# Patient Record
Sex: Male | Born: 1945 | Race: Black or African American | Hispanic: No | Smoking: Current every day smoker
Health system: Southern US, Community
[De-identification: ages and names within clinical notes are randomized; demographics above are authoritative.]

## PROBLEM LIST (undated history)

## (undated) DIAGNOSIS — L8990 Pressure ulcer of unspecified site, unspecified stage: Secondary | ICD-10-CM

## (undated) DIAGNOSIS — G893 Neoplasm related pain (acute) (chronic): Secondary | ICD-10-CM

## (undated) DIAGNOSIS — C61 Malignant neoplasm of prostate: Principal | ICD-10-CM

## (undated) DIAGNOSIS — E1143 Type 2 diabetes mellitus with diabetic autonomic (poly)neuropathy: Secondary | ICD-10-CM

## (undated) DIAGNOSIS — J449 Chronic obstructive pulmonary disease, unspecified: Secondary | ICD-10-CM

## (undated) DIAGNOSIS — Z96641 Presence of right artificial hip joint: Secondary | ICD-10-CM

## (undated) DIAGNOSIS — I10 Essential (primary) hypertension: Principal | ICD-10-CM

## (undated) DIAGNOSIS — E119 Type 2 diabetes mellitus without complications: Secondary | ICD-10-CM

## (undated) DIAGNOSIS — F329 Major depressive disorder, single episode, unspecified: Secondary | ICD-10-CM

## (undated) DIAGNOSIS — K257 Chronic gastric ulcer without hemorrhage or perforation: Secondary | ICD-10-CM

## (undated) DIAGNOSIS — Z794 Long term (current) use of insulin: Secondary | ICD-10-CM

## (undated) HISTORY — DX: Essential (primary) hypertension: I10

## (undated) HISTORY — DX: Type 2 diabetes mellitus with diabetic autonomic (poly)neuropathy: E11.43

## (undated) HISTORY — DX: Presence of right artificial hip joint: Z96.641

## (undated) HISTORY — PX: OTHER SURGICAL HISTORY: SHX169

## (undated) HISTORY — DX: Chronic obstructive pulmonary disease, unspecified: J44.9

## (undated) HISTORY — PX: TOTAL HIP ARTHROPLASTY: SHX124

## (undated) HISTORY — DX: Type 2 diabetes mellitus without complications: E11.9

## (undated) HISTORY — DX: Long term (current) use of insulin: Z79.4

## (undated) HISTORY — PX: PROSTATE SURGERY: SHX751

## (undated) HISTORY — DX: Chronic gastric ulcer without hemorrhage or perforation: K25.7

## (undated) HISTORY — DX: Malignant neoplasm of prostate: C61

## (undated) HISTORY — DX: Neoplasm related pain (acute) (chronic): G89.3

## (undated) HISTORY — DX: Major depressive disorder, single episode, unspecified: F32.9

---

## 2001-04-10 ENCOUNTER — Emergency Department (HOSPITAL_COMMUNITY): Admission: EM | Admit: 2001-04-10 | Discharge: 2001-04-10 | Payer: Self-pay | Admitting: Emergency Medicine

## 2012-12-15 ENCOUNTER — Telehealth: Payer: Self-pay | Admitting: Oncology

## 2012-12-15 NOTE — Telephone Encounter (Signed)
PT SCHEDULED TO SEE DR. GRANFORTUNA 08/06 @ 11; LABS 07/28 @ 12:15 REFERRING DR. THAKURI/[facility] DX- METS PROSTATE CA WELCOME PACKET MAILED.

## 2012-12-15 NOTE — Telephone Encounter (Signed)
C/D 12/15/12 for appt. 01/14/13

## 2012-12-22 ENCOUNTER — Telehealth: Payer: Self-pay | Admitting: *Deleted

## 2012-12-22 NOTE — Telephone Encounter (Signed)
Jack Barrett daughter called reporting he had "[facility]" in [place] and needs a referral for home care now that he is here with her.  Reported has open wounds to his tailbone and needs someone to perform baths. First appointment with provider at [facility] is January 14, 2013 and advised her to call his insurance company to determine what agency to use and Dr. Ruffin Frederick for a referral.   Tried to call back to instruct her to call [facility] for a referral but she does not have voicemail set up.

## 2013-01-05 ENCOUNTER — Other Ambulatory Visit: Payer: Self-pay | Admitting: Oncology

## 2013-01-05 ENCOUNTER — Encounter: Payer: Self-pay | Admitting: Oncology

## 2013-01-05 ENCOUNTER — Telehealth: Payer: Self-pay | Admitting: *Deleted

## 2013-01-05 ENCOUNTER — Telehealth: Payer: Self-pay | Admitting: Oncology

## 2013-01-05 ENCOUNTER — Other Ambulatory Visit (HOSPITAL_BASED_OUTPATIENT_CLINIC_OR_DEPARTMENT_OTHER): Payer: Medicare Other | Admitting: Lab

## 2013-01-05 DIAGNOSIS — C801 Malignant (primary) neoplasm, unspecified: Secondary | ICD-10-CM

## 2013-01-05 DIAGNOSIS — C61 Malignant neoplasm of prostate: Secondary | ICD-10-CM

## 2013-01-05 HISTORY — DX: Malignant neoplasm of prostate: C61

## 2013-01-05 LAB — COMPREHENSIVE METABOLIC PANEL (CC13)
ALT: 6 U/L (ref 0–55)
AST: 10 U/L (ref 5–34)
Albumin: 2.9 g/dL — ABNORMAL LOW (ref 3.5–5.0)
Alkaline Phosphatase: 100 U/L (ref 40–150)
BUN: 18.5 mg/dL (ref 7.0–26.0)
Calcium: 8.1 mg/dL — ABNORMAL LOW (ref 8.4–10.4)
Chloride: 101 mEq/L (ref 98–109)
Potassium: 4.1 mEq/L (ref 3.5–5.1)
Sodium: 135 mEq/L — ABNORMAL LOW (ref 136–145)
Total Protein: 7.2 g/dL (ref 6.4–8.3)

## 2013-01-05 LAB — CBC WITH DIFFERENTIAL/PLATELET
Basophils Absolute: 0.1 10*3/uL (ref 0.0–0.1)
EOS%: 0.8 % (ref 0.0–7.0)
Eosinophils Absolute: 0.1 10*3/uL (ref 0.0–0.5)
HGB: 11.3 g/dL — ABNORMAL LOW (ref 13.0–17.1)
MCH: 28.4 pg (ref 27.2–33.4)
MONO%: 5 % (ref 0.0–14.0)
NEUT#: 7.5 10*3/uL — ABNORMAL HIGH (ref 1.5–6.5)
RBC: 3.96 10*6/uL — ABNORMAL LOW (ref 4.20–5.82)
RDW: 17.6 % — ABNORMAL HIGH (ref 11.0–14.6)
lymph#: 1 10*3/uL (ref 0.9–3.3)

## 2013-01-05 NOTE — Telephone Encounter (Signed)
pt's daughter came by today and wants to know if Dr. Reece Agar will refill narcotics, talked to Amy H RN, she suggested that pt contact previous MD to refill meds

## 2013-01-05 NOTE — Telephone Encounter (Signed)
Patient here with his daughter.  She says he is running out of meds and asked can I do something for his wounds.  Discussed doctor patient relationship establishment.  Recommended she take him to ER for evaluation.  She left, expressed red tape.

## 2013-01-14 ENCOUNTER — Ambulatory Visit (HOSPITAL_BASED_OUTPATIENT_CLINIC_OR_DEPARTMENT_OTHER): Payer: Medicare Other | Admitting: Oncology

## 2013-01-14 ENCOUNTER — Ambulatory Visit: Payer: Medicare Other

## 2013-01-14 VITALS — BP 121/76 | HR 80 | Temp 97.7°F | Resp 18 | Ht 77.0 in | Wt 175.1 lb

## 2013-01-14 DIAGNOSIS — E1143 Type 2 diabetes mellitus with diabetic autonomic (poly)neuropathy: Secondary | ICD-10-CM

## 2013-01-14 DIAGNOSIS — C61 Malignant neoplasm of prostate: Secondary | ICD-10-CM

## 2013-01-14 DIAGNOSIS — Z794 Long term (current) use of insulin: Secondary | ICD-10-CM

## 2013-01-14 DIAGNOSIS — F329 Major depressive disorder, single episode, unspecified: Secondary | ICD-10-CM

## 2013-01-14 DIAGNOSIS — E1149 Type 2 diabetes mellitus with other diabetic neurological complication: Secondary | ICD-10-CM

## 2013-01-14 DIAGNOSIS — R627 Adult failure to thrive: Secondary | ICD-10-CM

## 2013-01-14 DIAGNOSIS — G893 Neoplasm related pain (acute) (chronic): Secondary | ICD-10-CM

## 2013-01-14 DIAGNOSIS — Z96641 Presence of right artificial hip joint: Secondary | ICD-10-CM

## 2013-01-14 DIAGNOSIS — Z96649 Presence of unspecified artificial hip joint: Secondary | ICD-10-CM

## 2013-01-14 DIAGNOSIS — I10 Essential (primary) hypertension: Secondary | ICD-10-CM

## 2013-01-14 DIAGNOSIS — F341 Dysthymic disorder: Secondary | ICD-10-CM

## 2013-01-14 DIAGNOSIS — K257 Chronic gastric ulcer without hemorrhage or perforation: Secondary | ICD-10-CM

## 2013-01-14 DIAGNOSIS — N189 Chronic kidney disease, unspecified: Secondary | ICD-10-CM

## 2013-01-14 DIAGNOSIS — J449 Chronic obstructive pulmonary disease, unspecified: Secondary | ICD-10-CM

## 2013-01-14 DIAGNOSIS — G909 Disorder of the autonomic nervous system, unspecified: Secondary | ICD-10-CM

## 2013-01-14 DIAGNOSIS — R32 Unspecified urinary incontinence: Secondary | ICD-10-CM

## 2013-01-14 DIAGNOSIS — C7951 Secondary malignant neoplasm of bone: Secondary | ICD-10-CM

## 2013-01-14 MED ORDER — ALPRAZOLAM 0.5 MG PO TABS: 1.0000 mg | ORAL_TABLET | Freq: Three times a day (TID) | ORAL | Status: DC | PRN

## 2013-01-14 MED ORDER — ONDANSETRON 8 MG PO TBDP: 8.0000 mg | ORAL_TABLET | Freq: Three times a day (TID) | ORAL | Status: DC | PRN

## 2013-01-14 MED ORDER — AMLODIPINE BESYLATE 5 MG PO TABS: 5.0000 mg | ORAL_TABLET | Freq: Every day | ORAL | Status: DC

## 2013-01-14 MED ORDER — HYDROMORPHONE HCL 4 MG PO TABS: 8.0000 mg | ORAL_TABLET | ORAL | Status: DC | PRN

## 2013-01-14 MED ORDER — ALPRAZOLAM 1 MG PO TABS: 1.0000 mg | ORAL_TABLET | Freq: Three times a day (TID) | ORAL | Status: DC | PRN

## 2013-01-14 MED ORDER — MORPHINE SULFATE ER 15 MG PO TBCR: 60.0000 mg | EXTENDED_RELEASE_TABLET | Freq: Three times a day (TID) | ORAL | Status: DC

## 2013-01-14 MED ORDER — ABIRATERONE ACETATE 250 MG PO TABS: 1000.0000 mg | ORAL_TABLET | Freq: Every day | ORAL | Status: DC

## 2013-01-14 MED ORDER — MORPHINE SULFATE ER 60 MG PO TBCR: 60.0000 mg | EXTENDED_RELEASE_TABLET | Freq: Three times a day (TID) | ORAL | Status: DC

## 2013-01-14 MED ORDER — HYDROMORPHONE HCL 8 MG PO TABS: 8.0000 mg | ORAL_TABLET | ORAL | Status: DC | PRN

## 2013-01-14 MED ORDER — PREDNISONE 5 MG PO TABS: 5.0000 mg | ORAL_TABLET | Freq: Two times a day (BID) | ORAL | Status: DC

## 2013-01-14 NOTE — Progress Notes (Signed)
New Patient Hematology-Oncology Evaluation   Jack Barrett [id number] 10/06/1945 67 y.o. 01/14/2013  CC: Dr Earvin Hansen, [facility] Dr, Sherrie Sport, [place]   Reason for referral:  Assume care of this man with metastatic prostate cancer who is moving to [place]   HPI:  67 year old man, retired [place],  and classical studies, high school teacher initially diagnosed with Gleason 4+5  prostate cancer in August 2007. He underwent radical prostatectomy with lymph node dissection 09/04/2006. One of 3 pelvic nodes positive with extranodal extension. Positive perineural invasion. Positive surgical margin. Negative seminal vesicle invasion. PSA was elevated postop He received postoperative radiation therapy. Marland Kitchen He was put on leuprolide through 2012. Initial treatment at the [facility] in [place] with subsequent care transferred to [facility] [place]. PSA normalized on the leuprolide. He elected to stop the medication in 2012. PSA began to rise up to 6.54 by March 2013, 16.6 by May, 62.3 by July. He was started on bicalutamide. PSA initially fell but then rose again up to 54.6 by 06/20/2012. He was started on a trial of Enzalutamide. He had poor tolerance to this drug with nausea, vomiting, and fatigue.  A bone scan from May 2013 showed that he now had metastatic disease  limited to third and ninth right ribs and right iliac crest. A bone scan done 08/12/12 showed further progression involving the cervical vertebrae, mid right clavicle, left mid sternum, multiple left ribs, left humerus, right iliac crest, left intertrochanteric area, right iliac crest. Apparently there was activity on a bone scan from May 2013 limited to third and ninth right ribs and right iliac crest.  He was started on chemotherapy with Taxotere every 3 weeks beginning 09/30/2012. He developed significant neutropenia. He was changed to a weekly regimen. Most recent PSA 11/19/12 was 13.2.  He was admitted to the  hospital in [place] in June with anorexia, debilitation, and dehydration nausea, vomiting, and abdominal pain. Apparently there was a pharmacy medication error prior to hospitalization when he received phenobarbital instead of Dilaudid which caused significant sedation and confusion. He slowly recovered from this. While hospitalized he had upper endoscopy on 11/24/2012. He was found to have mild, multifocal, esophagitis and duodenitis. Multiple nonbleeding gastric ulcers in the antrum. Biopsies from the antrum were normal with no signs of malignancy and negative for age pylori  He has not received any chemotherapy since prior to that hospitalization. Last dose administered on May 14.  He was living alone.  A friend of the family stole some of his narcotic analgesics. One of his daughters who lives in [place] offered to take care of him and she moved him here.  He has been taking large amounts of narcotic analgesics currently 45 mg of MS Contin 3 times a day with 4 mg of Dilaudid every 4 hours as well as Xanax 1 mg 3 times daily with only borderline control of his pain. Some of the pain is localized to areas of known bony involvement but he is also having pain in his ankles and his feet. Pain in his right hip where he had a previous hip replacement.  Performance status remains poor. He is spending his entire day in bed or in a chair. He is still having dysphagia for solids and intermittent nausea but no vomiting. He has been incontinent of urine since his initial surgery and radiation. He reports that surgical interventions to correct these have been unsuccessful.     PMH: Past Medical History  Diagnosis Date  .  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 Peripheral vascular disease status  post aorto bi-iliac bypass surgery. Peripheral neuropathy. GERD, recent antral ulceration, duodenitis, Hyperlipidemia, erectile dysfunction, type 2  diabetes, chronic renal insufficiency (CKD3), depression,  Past surgery: Radical prostatectomy, right hip replacement, aortobiiliac bypass surgery, Port-A-Cath placement.    Allergies: Allergies  Allergen Reactions  . Aspirin   . Codeine     Medications: HCTZ 50 mg daily, lisinopril 5 mg? daily, prednisone 5 mg twice a day, MS Contin 45 mg 3 times a day, Dilaudid 4 mg every 4 hours, Xanax 1 mg 3 times a day, lorazepam when necessary nausea. Celexa 40 mg QD;  Albuterol 2 puffs Q 4hrs prn; Flunisolide 2 sprays BID; Lorazepam dose? Prn nausea; loratidine 10 mg QD prn; gabapentin 1200 mg? HS;  Calcium w vitamin D BID; Insulin prn  Social History:  Retired Runner, broadcasting/film/video. One pack per day smoker. No alcohol. 3 healthy children.. One of his daughters accompanies him today.  Family History: No other family members with cancer. He has one brother.  Review of Systems: Constitutional symptoms: See above HEENT: No sore throat. Respiratory: No cough or dyspnea. Cardiovascular:  Intermittent chest pressure Gastrointestinal ROS: See above Genito-Urinary ROS: See above Hematological and Lymphatic: Musculoskeletal: See above Neurologic: Intermittent headache and blurred vision mostly orthostatic in nature Dermatologic: No rash Remaining ROS negative.  Physical Exam: Blood pressure 121/76, pulse 80, temperature 97.7 F (36.5 C), temperature source Oral, resp. rate 18, height 6\' 5"  (1.956 m), weight 175 lb 1.6 oz (79.425 kg), SpO2 98.00%. Wt Readings from Last 3 Encounters:  01/14/13 175 lb 1.6 oz (79.425 kg)    General appearance: Pleasant, cachectic, African American man sitting in a wheelchair HENNT: Pharynx no erythema exudate or ulcer, neck full range of motion Lymph nodes: No lymphadenopathy Breasts: Lungs: Clear to auscultation resonant to percussion Heart: Regular rhythm no murmur Vascular: No cyanosis Abdominal: Soft, nontender, no mass, GU: Extremities: No edema, no calf  tenderness Neurologic: Mental status intact, pupils are small, round, not reactive to light, motor strength 5 over 5 upper extremities, 4/5 lower extremities with right lower extremity strength less than left. Reflexes absent symmetric at the knees. Skin: No rash or ecchymosis    Lab Results: Lab Results  Component Value Date   WBC 9.0 01/05/2013   HGB 11.3* 01/05/2013   HCT 34.1* 01/05/2013   MCV 85.9 01/05/2013   PLT 200 01/05/2013     Chemistry      Component Value Date/Time   NA 135* 01/05/2013 1255   K 4.1 01/05/2013 1255   CO2 23 01/05/2013 1255   BUN 18.5 01/05/2013 1255   CREATININE 1.4* 01/05/2013 1255      Component Value Date/Time   CALCIUM 8.1* 01/05/2013 1255   ALKPHOS 100 01/05/2013 1255   AST 10 01/05/2013 1255   ALT 6 01/05/2013 1255   BILITOT 0.41 01/05/2013 1255    PSA 15.4 on 01/05/2013   Radiological Studies: See discussion above   Impression and Plan: #1. 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 Although he may have been showing a PSA response to recent Taxotere, I don't think he can physically tolerate coming into the office on a weekly basis for additional parenteral chemotherapy. I proposed a trial of Zytiga. 1000 mg daily. Continue low-dose prednisone. Both he and his daughter are agreeable to this. Continue bisphosphonates.  #2. Pain control issues I'm going to increase his MS Contin up  to 60 mg 3 times daily to see if we can minimize the breakthrough Dilaudid which he is using every 4 hours. I will double the dose of his Dilaudid up to 8 mg every 4 hours as needed.  #3. Urinary incontinence. He does not want to have a Foley catheter placed. I am going to stop his hydrochlorothiazide. This is just adding to the problem.  #4. Chronic renal insufficiency I'm going to stop his ACE inhibitor and substitute with Norvasc 5 mg daily. Additional antihypertensives as needed. He has lost a lot of weight and may not need extra  medication to control his pressure.  #5. Essential hypertension. See. #3 and #4 above  #6. Type 2 diabetes by history recently on insulin but this was not listed on his current med list from his daughter.  #7. Peripheral vascular disease status post aortobiiliac bypass  #8. Failure to thrive. We did not discuss end-of-life issues today. I will schedule a short interim followup visit to assess tolerance of the Zytiga and discuss other important issues around his ongoing cancer care.  Over 2 hours spent: 1 hour with direct patient contact,  1 hour reviewing outside records and preparing report     [name], MD 01/14/2013, 1:02 PM

## 2013-01-15 ENCOUNTER — Telehealth: Payer: Self-pay | Admitting: Oncology

## 2013-01-15 ENCOUNTER — Encounter: Payer: Self-pay | Admitting: Oncology

## 2013-01-15 DIAGNOSIS — G893 Neoplasm related pain (acute) (chronic): Secondary | ICD-10-CM

## 2013-01-15 DIAGNOSIS — J449 Chronic obstructive pulmonary disease, unspecified: Secondary | ICD-10-CM

## 2013-01-15 DIAGNOSIS — K257 Chronic gastric ulcer without hemorrhage or perforation: Secondary | ICD-10-CM

## 2013-01-15 DIAGNOSIS — IMO0001 Reserved for inherently not codable concepts without codable children: Secondary | ICD-10-CM

## 2013-01-15 DIAGNOSIS — E1143 Type 2 diabetes mellitus with diabetic autonomic (poly)neuropathy: Secondary | ICD-10-CM

## 2013-01-15 DIAGNOSIS — F329 Major depressive disorder, single episode, unspecified: Secondary | ICD-10-CM

## 2013-01-15 DIAGNOSIS — Z96641 Presence of right artificial hip joint: Secondary | ICD-10-CM

## 2013-01-15 DIAGNOSIS — I10 Essential (primary) hypertension: Secondary | ICD-10-CM

## 2013-01-15 HISTORY — DX: Reserved for inherently not codable concepts without codable children: IMO0001

## 2013-01-15 HISTORY — DX: Chronic gastric ulcer without hemorrhage or perforation: K25.7

## 2013-01-15 HISTORY — DX: Neoplasm related pain (acute) (chronic): G89.3

## 2013-01-15 HISTORY — DX: Type 2 diabetes mellitus with diabetic autonomic (poly)neuropathy: E11.43

## 2013-01-15 HISTORY — DX: Chronic obstructive pulmonary disease, unspecified: J44.9

## 2013-01-15 HISTORY — DX: Major depressive disorder, single episode, unspecified: F32.9

## 2013-01-15 HISTORY — DX: Presence of right artificial hip joint: Z96.641

## 2013-01-15 HISTORY — DX: Essential (primary) hypertension: I10

## 2013-01-15 NOTE — Telephone Encounter (Signed)
lvm for pt regarding to appt for 8.27.14 ...mailed pt appt sched and avs with letter

## 2013-01-16 ENCOUNTER — Encounter: Payer: Self-pay | Admitting: Oncology

## 2013-01-16 NOTE — Progress Notes (Signed)
Jack Barrett from [employer] has been trying to get in touch with the patient to offer him assistance with zytiga, his copay is $2000.  I called the patient also to let him know she is trying to talk to him and to please return her call.

## 2013-01-20 ENCOUNTER — Encounter: Payer: Self-pay | Admitting: Oncology

## 2013-01-20 NOTE — Progress Notes (Signed)
Per Tamika patient was approved for Zytiga -- PAN-zero  01/19/13-01/19/24  $11000.00. I will send to billing and medical records.

## 2013-01-21 ENCOUNTER — Other Ambulatory Visit: Payer: Self-pay | Admitting: *Deleted

## 2013-01-21 DIAGNOSIS — I10 Essential (primary) hypertension: Secondary | ICD-10-CM

## 2013-01-21 DIAGNOSIS — C61 Malignant neoplasm of prostate: Secondary | ICD-10-CM

## 2013-01-21 MED ORDER — MORPHINE SULFATE ER 60 MG PO TBCR: 60.0000 mg | EXTENDED_RELEASE_TABLET | Freq: Three times a day (TID) | ORAL | Status: DC

## 2013-01-21 NOTE — Telephone Encounter (Addendum)
Dtr called.   Nicholette.  Patient only got a 7 day supply of his MS Contin due to the way it was written.Marland Kitchen Spoke with Pharmacist at [facility] [phone].  Script was written for MS Contin 15mg . He needed 60mg .  They gave him the equivalent of #90 of the 15mg .  So that was #23 of the 60 mg.  So patient needs a new script. Called Nicholette and let her know that script was available for pick up.

## 2013-01-22 ENCOUNTER — Encounter: Payer: Self-pay | Admitting: *Deleted

## 2013-01-22 NOTE — Progress Notes (Signed)
RECEIVED A FAX FROM [employer] CONCERNING A CONFIRMATION OF PRESCRIPTION SHIPMENT FOR ZYTIGA ON 01/21/13.

## 2013-02-04 ENCOUNTER — Other Ambulatory Visit (HOSPITAL_BASED_OUTPATIENT_CLINIC_OR_DEPARTMENT_OTHER): Payer: Medicare Other | Admitting: Lab

## 2013-02-04 ENCOUNTER — Ambulatory Visit (HOSPITAL_BASED_OUTPATIENT_CLINIC_OR_DEPARTMENT_OTHER): Payer: Medicare Other | Admitting: Oncology

## 2013-02-04 VITALS — BP 137/79 | HR 68 | Temp 98.9°F | Resp 18 | Ht 77.0 in | Wt 183.2 lb

## 2013-02-04 DIAGNOSIS — Z96641 Presence of right artificial hip joint: Secondary | ICD-10-CM

## 2013-02-04 DIAGNOSIS — R32 Unspecified urinary incontinence: Secondary | ICD-10-CM

## 2013-02-04 DIAGNOSIS — I10 Essential (primary) hypertension: Secondary | ICD-10-CM

## 2013-02-04 DIAGNOSIS — C7951 Secondary malignant neoplasm of bone: Secondary | ICD-10-CM

## 2013-02-04 DIAGNOSIS — C61 Malignant neoplasm of prostate: Secondary | ICD-10-CM

## 2013-02-04 DIAGNOSIS — G893 Neoplasm related pain (acute) (chronic): Secondary | ICD-10-CM

## 2013-02-04 LAB — COMPREHENSIVE METABOLIC PANEL (CC13)
ALT: 8 U/L (ref 0–55)
CO2: 22 mEq/L (ref 22–29)
Calcium: 8.4 mg/dL (ref 8.4–10.4)
Chloride: 109 mEq/L (ref 98–109)
Creatinine: 1.1 mg/dL (ref 0.7–1.3)
Glucose: 152 mg/dl — ABNORMAL HIGH (ref 70–140)
Total Bilirubin: 0.34 mg/dL (ref 0.20–1.20)
Total Protein: 6.2 g/dL — ABNORMAL LOW (ref 6.4–8.3)

## 2013-02-04 LAB — CBC WITH DIFFERENTIAL/PLATELET
BASO%: 0.1 % (ref 0.0–2.0)
Eosinophils Absolute: 0.1 10*3/uL (ref 0.0–0.5)
HCT: 37 % — ABNORMAL LOW (ref 38.4–49.9)
MCHC: 31.6 g/dL — ABNORMAL LOW (ref 32.0–36.0)
MONO#: 0.5 10*3/uL (ref 0.1–0.9)
NEUT#: 6.2 10*3/uL (ref 1.5–6.5)
RBC: 4.2 10*6/uL (ref 4.20–5.82)
WBC: 8 10*3/uL (ref 4.0–10.3)
lymph#: 1.2 10*3/uL (ref 0.9–3.3)

## 2013-02-04 MED ORDER — HYDROMORPHONE HCL 8 MG PO TABS: 8.0000 mg | ORAL_TABLET | ORAL | Status: DC | PRN

## 2013-02-04 MED ORDER — MORPHINE SULFATE ER 60 MG PO TBCR: 60.0000 mg | EXTENDED_RELEASE_TABLET | Freq: Three times a day (TID) | ORAL | Status: DC

## 2013-02-05 NOTE — Progress Notes (Signed)
Hematology and Oncology Follow Up Visit  Jack Barrett [id number] May 03, 1946 67 y.o. 02/05/2013 7:47 PM   Principle Diagnosis: Encounter Diagnoses  Name Primary?  . Prostate cancer, primary, with metastasis from prostate to other site   . Status post right hip replacement   . HTN (hypertension), benign   . Cancer associated pain   . 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 He has prostate cancer now metastatic to bone. He progressed through initial hormonal therapy. Just prior to moving to [place] he was started on a trial of Taxotere. Although he appeared to be having a PSA response, his overall performance status declined rapidly and when I first saw him on August 6, I did not feel that the benefits of continuing Taxotere outweighed the risks. In reviewing his records, I saw that he had never been given a trial of abiraterone. I started him on this drug at 1000 mg daily and continued his low-dose prednisone. I adjusted his pain medications increasing his MS Contin up to 60 mg 3 times daily and increase his when necessary Dilaudid up to 8 mg every 4 hours as needed for breakthrough pain.  I was very pleased to see a significant stabilization today. His pain needs are being met better. His performance status has improved. He is still having problems with urinary incontinence and local skin breakdown from the urine. He is now requested a Foley catheter be placed. He was opposed to this in the past. I did stop hydrochlorothiazide the time of his recent visit to minimize urination and also stopped his ACE inhibitor in view of progressive renal insufficiency and substituted with Norvasc.  Medications: reviewed  Allergies:  Allergies  Allergen Reactions  . Aspirin   . Codeine     Review of Systems: Constitutional:  Improving constitutional symptoms   Respiratory: No cough or dyspnea Cardiovascular:  No chest pain or palpitations Gastrointestinal: No abdominal pain. Constipation from narcotics. Genito-Urinary: See above Musculoskeletal: Stable pain level on current regimen Neurologic: No headache or change in vision Skin: No rash. Skin breakdown due to urine spillage. Remaining ROS negative.  Physical Exam: Blood pressure 137/79, pulse 68, temperature 98.9 F (37.2 C), temperature source Oral, resp. rate 18, height 6\' 5"  (1.956 m), weight 183 lb 3.2 oz (83.099 kg), SpO2 100.00%. Wt Readings from Last 3 Encounters:  02/04/13 183 lb 3.2 oz (83.099 kg)  01/14/13 175 lb 1.6 oz (79.425 kg)     General appearance: Less cachectic appearing thin, visit here on August 6 HENNT: Pharynx no erythema or exudate Lymph nodes: No adenopathy Breasts: Lungs: Clear to auscultation resonant to percussion Heart: Regular rhythm no murmur Abdomen: Soft, nontender Extremities: No edema no calf tenderness Musculoskeletal: No joint deformities GU: Vascular: No cyanosis Neurologic: Motor strength 5 over 5 reflexes 1+ symmetric Skin: Perineal area not examined  Lab Results: Lab Results  Component Value Date   WBC 8.0 02/04/2013   HGB 11.7* 02/04/2013   HCT 37.0* 02/04/2013   MCV 88.1 02/04/2013   PLT 142 02/04/2013     Chemistry      Component Value Date/Time   NA 142 02/04/2013 1526   K 4.0 02/04/2013 1526   CO2 22 02/04/2013 1526   BUN 12.7 02/04/2013 1526   CREATININE 1.1 02/04/2013 1526      Component Value Date/Time  CALCIUM 8.4 02/04/2013 1526   ALKPHOS 93 02/04/2013 1526   AST 8 02/04/2013 1526   ALT 8 02/04/2013 1526   BILITOT 0.34 02/04/2013 1526      Impression: #1. 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 Too early to assess response. I will check a PSA at time of his next visit.  #2. Pain control Improved with adjustments made at  time of last visit. See above  #3. Urinary incontinence He now requests a Foley catheter. I will make a urology referral. Will also try to get home nursing assistance.  computer message sent to our Child psychotherapist.  #4. Chronic renal insufficiency Creatinine today is improved off ACE inhibitor and diuretic: 1.1  #5. Essential hypertension. Despite stopping ACE inhibitor and diuretic, blood pressure today is normal at 137/79 on single agent Norvasc 5 mg daily.  #6. Type 2 diabetes.  #7. Peripheral vascular disease with prior aortobiiliac bypass surgery  #8. Failure to thrive. He is doing much better in the protective environment of his daughter here in [place].    CC:. Jack Barrett, [facility]   Jack Barrett, [place] 8/28/20147:47 PM

## 2013-02-11 ENCOUNTER — Other Ambulatory Visit: Payer: Self-pay | Admitting: *Deleted

## 2013-02-11 DIAGNOSIS — C61 Malignant neoplasm of prostate: Secondary | ICD-10-CM

## 2013-02-13 ENCOUNTER — Telehealth: Payer: Self-pay | Admitting: *Deleted

## 2013-02-13 NOTE — Telephone Encounter (Signed)
Synetta Fail from [facility] called saying that they had contacted the patient about his Zytiga, however he would not give them his DOB so they were unable to go any further with the call.  They requested that I contact the patient and resolve this.  Called the patient and spoke with his dtr.,Nicolette.  She states that the patient is very volatile and has therefore given her POA and wants her to handle everything.  Asked her to contact [facility] @ [phone]/QIONGE 1.

## 2013-02-16 ENCOUNTER — Telehealth: Payer: Self-pay | Admitting: *Deleted

## 2013-02-16 NOTE — Telephone Encounter (Signed)
Received call from [facility] stating that they have been trying to reach pt to arrange delivery of zytiga & have been unsuccessful & asked that we try to reach him & have him call them at [phone].  Called & spoke with pt's daughter & she states that she just got off the phone with them & they are to call her back.

## 2013-02-19 ENCOUNTER — Telehealth: Payer: Self-pay | Admitting: *Deleted

## 2013-02-19 NOTE — Telephone Encounter (Signed)
Heather /  [facility] called.  Patient has Stage 3 pressure ulcer on cocyx.   He does not have a PCP yet and needs orders for this. 1.  Medical Social Worker to help patient get connected with a PCP. 2. Evaluation and treatment from wound nurse for pressure ulcer. 3.Cushion for pressure ulcer. 4. High Protein Supplement / Juven to help with wound healing. Dr. Cyndie Chime is agreeable with all of this on a temporary basis, until patient has PCP. Call back for Herbert Seta is [phone].

## 2013-02-20 ENCOUNTER — Telehealth: Payer: Self-pay | Admitting: Oncology

## 2013-02-20 NOTE — Telephone Encounter (Signed)
MAILED OFFICE NOTES TO DR. Belia Heman

## 2013-03-02 ENCOUNTER — Telehealth: Payer: Self-pay | Admitting: *Deleted

## 2013-03-02 NOTE — Telephone Encounter (Signed)
Received call from Heather/RN/[facility] stating that pt was seen today & she reports that port site has a hole measuring 0.2 x 0.2 cm.  Discussed with Dr Cyndie Chime & referral to General surgeon suggested.  Pt probably had port placed in [place].  Referral made to Dr. Corliss Skains for 03/05/13 @ 2:20 pm to evaluate.  Heather RN/[facility] notified of appt & informed to keep clean & dry & covered until seen by surgeon.  Pt also notified of appt.

## 2013-03-05 ENCOUNTER — Ambulatory Visit (INDEPENDENT_AMBULATORY_CARE_PROVIDER_SITE_OTHER): Payer: Medicare Other | Admitting: Surgery

## 2013-03-06 ENCOUNTER — Telehealth: Payer: Self-pay | Admitting: *Deleted

## 2013-03-06 ENCOUNTER — Encounter (HOSPITAL_COMMUNITY): Payer: Self-pay

## 2013-03-06 ENCOUNTER — Emergency Department (HOSPITAL_COMMUNITY)
Admission: EM | Admit: 2013-03-06 | Discharge: 2013-03-06 | Disposition: A | Discharge status: Home / Self Care | Payer: Medicare Other | Source: Home / Self Care | Attending: Emergency Medicine | Admitting: Emergency Medicine

## 2013-03-06 ENCOUNTER — Emergency Department (HOSPITAL_COMMUNITY): Payer: Medicare Other

## 2013-03-06 DIAGNOSIS — Z79899 Other long term (current) drug therapy: Secondary | ICD-10-CM | POA: Insufficient documentation

## 2013-03-06 DIAGNOSIS — J449 Chronic obstructive pulmonary disease, unspecified: Secondary | ICD-10-CM | POA: Insufficient documentation

## 2013-03-06 DIAGNOSIS — C61 Malignant neoplasm of prostate: Secondary | ICD-10-CM | POA: Insufficient documentation

## 2013-03-06 DIAGNOSIS — E1149 Type 2 diabetes mellitus with other diabetic neurological complication: Secondary | ICD-10-CM | POA: Insufficient documentation

## 2013-03-06 DIAGNOSIS — G909 Disorder of the autonomic nervous system, unspecified: Secondary | ICD-10-CM | POA: Insufficient documentation

## 2013-03-06 DIAGNOSIS — F17200 Nicotine dependence, unspecified, uncomplicated: Secondary | ICD-10-CM | POA: Insufficient documentation

## 2013-03-06 DIAGNOSIS — Z96649 Presence of unspecified artificial hip joint: Secondary | ICD-10-CM | POA: Insufficient documentation

## 2013-03-06 DIAGNOSIS — F341 Dysthymic disorder: Secondary | ICD-10-CM | POA: Insufficient documentation

## 2013-03-06 DIAGNOSIS — T827XXA Infection and inflammatory reaction due to other cardiac and vascular devices, implants and grafts, initial encounter: Secondary | ICD-10-CM | POA: Insufficient documentation

## 2013-03-06 DIAGNOSIS — Y838 Other surgical procedures as the cause of abnormal reaction of the patient, or of later complication, without mention of misadventure at the time of the procedure: Secondary | ICD-10-CM | POA: Insufficient documentation

## 2013-03-06 DIAGNOSIS — Z872 Personal history of diseases of the skin and subcutaneous tissue: Secondary | ICD-10-CM | POA: Insufficient documentation

## 2013-03-06 DIAGNOSIS — Z8669 Personal history of other diseases of the nervous system and sense organs: Secondary | ICD-10-CM | POA: Insufficient documentation

## 2013-03-06 DIAGNOSIS — T80212A Local infection due to central venous catheter, initial encounter: Secondary | ICD-10-CM

## 2013-03-06 DIAGNOSIS — J4489 Other specified chronic obstructive pulmonary disease: Secondary | ICD-10-CM | POA: Insufficient documentation

## 2013-03-06 DIAGNOSIS — E119 Type 2 diabetes mellitus without complications: Secondary | ICD-10-CM | POA: Insufficient documentation

## 2013-03-06 DIAGNOSIS — I10 Essential (primary) hypertension: Secondary | ICD-10-CM | POA: Insufficient documentation

## 2013-03-06 HISTORY — DX: Pressure ulcer of unspecified site, unspecified stage: L89.90

## 2013-03-06 MED ORDER — AMOXICILLIN-POT CLAVULANATE 875-125 MG PO TABS: 1.0000 | ORAL_TABLET | Freq: Two times a day (BID) | ORAL | Status: DC

## 2013-03-06 NOTE — ED Notes (Signed)
Pt's daughter states she is unable to take pt up the stairs at her apt and wants an ambulance to bring him home. Called [facility] to take pt back to his residence.

## 2013-03-06 NOTE — Telephone Encounter (Signed)
Nurse from [facility] called and left message that patient has Right PAC.  It is draining yellow-greenish drainage.  The incision has a .2cm  X .2cm  hole in the incision( size of Qtip) where the drainage is coming from. It had opsite over it and moisture had collected over the [facility].  She reports the sight is tender and painful.  Patient is afebrile.  She thinks the patient needs to be seen ASAP.   Called nurse back and got her VM,  Discussed with Dr. Truett Perna in Dr. Patsy Lager absence.  PAC was probably placed in [place] and patient does not have a Careers adviser here.  So patient needs to go to ED to have surgeon evaluate it.    Called home number and spoke with dtr., Nicollette.  She is physically unable to get her father to the ED.  She has no car right now and even if she did, she is unable to get him in the car herself.  Instructed her to call 911 to transport patient to the ED.  She said she would. Raynelle Fanning, nurse from [facility] called back and she will make sure dtr. called 911.  If they did not she will go to home and call 911 herself.

## 2013-03-06 NOTE — ED Notes (Signed)
Per EMS- Patient was sent to the ED by the wound care center. Patient has an infected portacath and a decubitus on his buttocks. Patient and patient's daughter refused to give EMS information regarding how long he has had issues with the portacath, decubitus or to allow EMS to get a full set of vitals.

## 2013-03-06 NOTE — ED Notes (Signed)
Pt incontinent of urine. Pt cleaned and new Depends placed on pt by Leonette Most, NT.

## 2013-03-06 NOTE — ED Provider Notes (Addendum)
CSN: [id number]     Arrival date & time 03/06/13  1632 History   First MD Initiated Contact with Patient 03/06/13 1949     Chief Complaint  Patient presents with  . infected portacath   . Skin Ulcer   (Consider location/radiation/quality/duration/timing/severity/associated sxs/prior Treatment) The history is provided by the patient and a relative.   Patient here with drainage from his right chest Port-A-Cath site. Denies any fever or chills. Denies any vomiting or diarrhea. Was seen by his home health nurse and EMS called. Patient's last chemotherapy was one month ago. Symptoms have been persistent and nothing makes it better or worse. No treatment used prior to arrival Past Medical History  Diagnosis Date  . Prostate cancer, primary, with metastasis from prostate to other site 01/05/2013  . HTN (hypertension), benign 01/15/2013  . COPD (chronic obstructive pulmonary disease) 01/15/2013  . IDDM (insulin dependent diabetes mellitus) 01/15/2013  . 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 Cancer associated pain 01/15/2013  . Status post right hip replacement 01/15/2013  . Chronic ulcer of gastric fundus 01/15/2013    EGD 11/24/12  . Depression, reactive 01/15/2013  . Decubitus ulcer    Past Surgical History  Procedure Laterality Date  . Total hip arthroplasty    . Prostate surgery    . Porta cath    . Skin grafts     History reviewed. No pertinent family history. History  Substance Use Topics  . Smoking status: Current Every Day Smoker -- 1.00 packs/day    Types: Cigarettes  . Smokeless tobacco: Never Used  . Alcohol Use: No    Review of Systems  All other systems reviewed and are negative.    Allergies  Aspirin and Codeine  Home Medications   Current Outpatient Rx  Name  Route  Sig  Dispense  Refill  . abiraterone Acetate (ZYTIGA) 250 MG tablet   Oral   Take 4 tablets (1,000 mg total) by mouth daily. Take on an empty stomach 1 hour before or 2 hours after a meal  120 tablet   3     Script given to [facility] 01/15/16   . ALPRAZolam (XANAX) 1 MG tablet   Oral   Take 1 tablet (1 mg total) by mouth 3 (three) times daily as needed for sleep or anxiety.   90 tablet   2     Script given in office visit 01/14/13   . amLODipine (NORVASC) 5 MG tablet   Oral   Take 1 tablet (5 mg total) by mouth daily.   30 tablet   4   . HYDROmorphone (DILAUDID) 8 MG tablet   Oral   Take 1 tablet (8 mg total) by mouth every 4 (four) hours as needed for pain.   180 tablet   0     Script given in office visit 01/14/13   . morphine (MS CONTIN) 60 MG 12 hr tablet   Oral   Take 1 tablet (60 mg total) by mouth 3 (three) times daily after meals.   90 tablet   0     See documentation note for today from dtr and phar ...   . predniSONE (DELTASONE) 5 MG tablet   Oral   Take 1 tablet (5 mg total) by mouth 2 (two) times daily. Takes 5mg  twice a day   120 tablet   3   . ondansetron (ZOFRAN ODT) 8 MG disintegrating tablet   Oral   Take 1 tablet (8 mg total) by  mouth every 8 (eight) hours as needed for nausea.   20 tablet   6    BP 148/73  Pulse 60  Temp(Src) 98.1 F (36.7 C) (Oral)  Resp 16  Ht 6\' 7"  (2.007 m)  Wt 184 lb (83.462 kg)  BMI 20.72 kg/m2  SpO2 98% Physical Exam  Nursing note and vitals reviewed. Constitutional: He is oriented to person, place, and time. He appears well-developed and well-nourished.  Non-toxic appearance. No distress.  HENT:  Head: Normocephalic and atraumatic.  Eyes: Conjunctivae, EOM and lids are normal. Pupils are equal, round, and reactive to light.  Neck: Normal range of motion. Neck supple. No tracheal deviation present. No mass present.  Cardiovascular: Normal rate, regular rhythm and normal heart sounds.  Exam reveals no gallop.   No murmur heard. Pulmonary/Chest: Effort normal and breath sounds normal. No stridor. No respiratory distress. He has no decreased breath sounds. He has no wheezes. He has no rhonchi. He has no  rales.    Abdominal: Soft. Normal appearance and bowel sounds are normal. He exhibits no distension. There is no tenderness. There is no rebound and no CVA tenderness.  Musculoskeletal: Normal range of motion. He exhibits no edema and no tenderness.  Neurological: He is alert and oriented to person, place, and time. He has normal strength. No cranial nerve deficit or sensory deficit. GCS eye subscore is 4. GCS verbal subscore is 5. GCS motor subscore is 6.  Skin: Skin is warm and dry. No abrasion and no rash noted.  Psychiatric: His speech is normal. His affect is blunt. He is slowed.    ED Course  Procedures (including critical care time) Labs Review Labs Reviewed - No data to display Imaging Review No results found.  MDM  No diagnosis found. Spoke with general surgery on call, Dr. Gerrit Friends, and patient will be placed on Augmentin and will be seen in the cancer Center next week to have the Port-A-Cath removed by general surgery.    Toy Baker, MD 03/06/13 1610  Toy Baker, MD 03/06/13 [phone]

## 2013-03-06 NOTE — ED Notes (Signed)
[facility] here to take pt back to residence.  

## 2013-03-06 NOTE — ED Notes (Signed)
Pt states he has had his port a cath for about 3 or 4 months. Pt states he has noticed pain and drainage from site for the past few weeks. Pt states he last had chemo about a month ago. Pt also states he has L side rib cage pain and coccyx pain from falling a few months ago. Pt alert, age appro. No acute distress.

## 2013-03-07 ENCOUNTER — Other Ambulatory Visit: Payer: Self-pay | Admitting: Oncology

## 2013-03-07 NOTE — Progress Notes (Signed)
Called by patient's daughter who was very dissatisfied with care her father received in ED yesterday.She tells me the [facility] came by, expressed pus from the port site, and told them they needed to go to the ED emergently to receive IV antibiotics and that Dr Cyndie Chime would meet them there. The daughter tells me they were not met there by an MD, that they had to wait many hours before being seen, which was hard on her father, and that they "did not take the problem seriously." She now is worried her father is getting worse.   The daughter apparently was not present when the ED MD examined her father. She tells me "they put him on amoxicilling; I know that's too weak." They have not started that antibiotic.  According to the daughter, there is more erythema and pain over the port site today; no fever.  I explained that we do not generally see our patients in the ED and that Dr Cyndie Chime is currently our of town in any case. Normally we direct patients to the ED if necessary and they are evaluated by the ED physician.  I suggested as far as her dissatisfaction goes that she write a letter to document her experience and we can help her get it to the appropriate administrator. However, I explained I was concerned the antibiotic was not yet started. I asked her to take her father back to the ED--she did not want [place] to [facility] so I directed them to [facility] are transportation issues so I directed her to call [phone]. They will contact us as needed.

## 2013-03-08 ENCOUNTER — Inpatient Hospital Stay (HOSPITAL_COMMUNITY)
Admission: EM | Admit: 2013-03-08 | Discharge: 2013-03-12 | DRG: 315 | Disposition: A | Discharge status: Home Health | Payer: Medicare Other | Attending: Internal Medicine | Admitting: Internal Medicine

## 2013-03-08 ENCOUNTER — Encounter (HOSPITAL_COMMUNITY): Payer: Self-pay | Admitting: Emergency Medicine

## 2013-03-08 DIAGNOSIS — T80219D Unspecified infection due to central venous catheter, subsequent encounter: Secondary | ICD-10-CM

## 2013-03-08 DIAGNOSIS — I10 Essential (primary) hypertension: Secondary | ICD-10-CM

## 2013-03-08 DIAGNOSIS — R627 Adult failure to thrive: Secondary | ICD-10-CM

## 2013-03-08 DIAGNOSIS — Y849 Medical procedure, unspecified as the cause of abnormal reaction of the patient, or of later complication, without mention of misadventure at the time of the procedure: Secondary | ICD-10-CM | POA: Diagnosis present

## 2013-03-08 DIAGNOSIS — T80219S Unspecified infection due to central venous catheter, sequela: Secondary | ICD-10-CM

## 2013-03-08 DIAGNOSIS — F17200 Nicotine dependence, unspecified, uncomplicated: Secondary | ICD-10-CM | POA: Diagnosis present

## 2013-03-08 DIAGNOSIS — C61 Malignant neoplasm of prostate: Secondary | ICD-10-CM

## 2013-03-08 DIAGNOSIS — J4489 Other specified chronic obstructive pulmonary disease: Secondary | ICD-10-CM | POA: Diagnosis present

## 2013-03-08 DIAGNOSIS — T80212A Local infection due to central venous catheter, initial encounter: Principal | ICD-10-CM | POA: Diagnosis present

## 2013-03-08 DIAGNOSIS — E119 Type 2 diabetes mellitus without complications: Secondary | ICD-10-CM

## 2013-03-08 DIAGNOSIS — G589 Mononeuropathy, unspecified: Secondary | ICD-10-CM | POA: Diagnosis present

## 2013-03-08 DIAGNOSIS — E46 Unspecified protein-calorie malnutrition: Secondary | ICD-10-CM | POA: Diagnosis present

## 2013-03-08 DIAGNOSIS — F329 Major depressive disorder, single episode, unspecified: Secondary | ICD-10-CM

## 2013-03-08 DIAGNOSIS — T80218A Other infection due to central venous catheter, initial encounter: Secondary | ICD-10-CM

## 2013-03-08 DIAGNOSIS — R1310 Dysphagia, unspecified: Secondary | ICD-10-CM | POA: Diagnosis present

## 2013-03-08 DIAGNOSIS — E1149 Type 2 diabetes mellitus with other diabetic neurological complication: Secondary | ICD-10-CM | POA: Diagnosis present

## 2013-03-08 DIAGNOSIS — Z96649 Presence of unspecified artificial hip joint: Secondary | ICD-10-CM

## 2013-03-08 DIAGNOSIS — K257 Chronic gastric ulcer without hemorrhage or perforation: Secondary | ICD-10-CM | POA: Diagnosis present

## 2013-03-08 DIAGNOSIS — IMO0002 Reserved for concepts with insufficient information to code with codable children: Secondary | ICD-10-CM

## 2013-03-08 DIAGNOSIS — Z794 Long term (current) use of insulin: Secondary | ICD-10-CM

## 2013-03-08 DIAGNOSIS — A4902 Methicillin resistant Staphylococcus aureus infection, unspecified site: Secondary | ICD-10-CM | POA: Diagnosis present

## 2013-03-08 DIAGNOSIS — G893 Neoplasm related pain (acute) (chronic): Secondary | ICD-10-CM

## 2013-03-08 DIAGNOSIS — G909 Disorder of the autonomic nervous system, unspecified: Secondary | ICD-10-CM | POA: Diagnosis present

## 2013-03-08 DIAGNOSIS — C7951 Secondary malignant neoplasm of bone: Secondary | ICD-10-CM | POA: Diagnosis present

## 2013-03-08 DIAGNOSIS — T80219A Unspecified infection due to central venous catheter, initial encounter: Secondary | ICD-10-CM

## 2013-03-08 DIAGNOSIS — J449 Chronic obstructive pulmonary disease, unspecified: Secondary | ICD-10-CM

## 2013-03-08 DIAGNOSIS — C801 Malignant (primary) neoplasm, unspecified: Secondary | ICD-10-CM

## 2013-03-08 DIAGNOSIS — F3289 Other specified depressive episodes: Secondary | ICD-10-CM | POA: Diagnosis present

## 2013-03-08 DIAGNOSIS — Z452 Encounter for adjustment and management of vascular access device: Secondary | ICD-10-CM

## 2013-03-08 DIAGNOSIS — T827XXA Infection and inflammatory reaction due to other cardiac and vascular devices, implants and grafts, initial encounter: Secondary | ICD-10-CM

## 2013-03-08 DIAGNOSIS — IMO0001 Reserved for inherently not codable concepts without codable children: Secondary | ICD-10-CM

## 2013-03-08 LAB — BASIC METABOLIC PANEL
BUN: 9 mg/dL (ref 6–23)
CO2: 25 mEq/L (ref 19–32)
Calcium: 8.7 mg/dL (ref 8.4–10.5)
Creatinine, Ser: 0.91 mg/dL (ref 0.50–1.35)
GFR calc non Af Amer: 88 mL/min — ABNORMAL LOW (ref >90)
Glucose, Bld: 122 mg/dL — ABNORMAL HIGH (ref 70–99)
Sodium: 135 mEq/L (ref 135–145)

## 2013-03-08 LAB — CBC WITH DIFFERENTIAL/PLATELET
Basophils Absolute: 0 10*3/uL (ref 0.0–0.1)
Eosinophils Relative: 1 % (ref 0–5)
HCT: 38.8 % — ABNORMAL LOW (ref 39.0–52.0)
Hemoglobin: 12.5 g/dL — ABNORMAL LOW (ref 13.0–17.0)
Lymphocytes Relative: 12 % (ref 12–46)
MCV: 87.6 fL (ref 78.0–100.0)
Monocytes Absolute: 0.6 10*3/uL (ref 0.1–1.0)
Monocytes Relative: 8 % (ref 3–12)
Neutro Abs: 6.1 10*3/uL (ref 1.7–7.7)
Platelets: 195 10*3/uL (ref 150–400)
RBC: 4.43 MIL/uL (ref 4.22–5.81)
RDW: 17.8 % — ABNORMAL HIGH (ref 11.5–15.5)
WBC: 7.8 10*3/uL (ref 4.0–10.5)

## 2013-03-08 LAB — GLUCOSE, CAPILLARY: Glucose-Capillary: 145 mg/dL — ABNORMAL HIGH (ref 70–99)

## 2013-03-08 MED ORDER — ABIRATERONE ACETATE 250 MG PO TABS
1000.0000 mg | ORAL_TABLET | ORAL | Status: DC
  Filled 2013-03-08: qty 4

## 2013-03-08 MED ORDER — SODIUM CHLORIDE 0.9 % IV SOLN
Freq: Once | INTRAVENOUS | Status: AC
  Administered 2013-03-08: 14:00:00 via INTRAVENOUS

## 2013-03-08 MED ORDER — ONDANSETRON HCL 4 MG/2ML IJ SOLN: 4.0000 mg | Freq: Four times a day (QID) | INTRAMUSCULAR | Status: DC | PRN

## 2013-03-08 MED ORDER — HYDROMORPHONE HCL PF 1 MG/ML IJ SOLN
1.0000 mg | Freq: Once | INTRAMUSCULAR | Status: AC
  Administered 2013-03-08: 1 mg via INTRAVENOUS
  Filled 2013-03-08: qty 1

## 2013-03-08 MED ORDER — MORPHINE SULFATE ER 60 MG PO TBCR
60.0000 mg | EXTENDED_RELEASE_TABLET | Freq: Three times a day (TID) | ORAL | Status: DC
  Administered 2013-03-08 – 2013-03-12 (×11): 60 mg via ORAL
  Filled 2013-03-08 (×11): qty 2

## 2013-03-08 MED ORDER — INSULIN ASPART 100 UNIT/ML ~~LOC~~ SOLN
0.0000 [IU] | Freq: Three times a day (TID) | SUBCUTANEOUS | Status: DC
  Administered 2013-03-09: 1 [IU] via SUBCUTANEOUS
  Administered 2013-03-10: 2 [IU] via SUBCUTANEOUS
  Administered 2013-03-10 (×2): 1 [IU] via SUBCUTANEOUS
  Administered 2013-03-11 (×2): 2 [IU] via SUBCUTANEOUS
  Administered 2013-03-11: 1 [IU] via SUBCUTANEOUS
  Administered 2013-03-12: 2 [IU] via SUBCUTANEOUS
  Administered 2013-03-12: 1 [IU] via SUBCUTANEOUS

## 2013-03-08 MED ORDER — PREDNISONE 5 MG PO TABS
5.0000 mg | ORAL_TABLET | Freq: Two times a day (BID) | ORAL | Status: DC
  Administered 2013-03-08 – 2013-03-12 (×7): 5 mg via ORAL
  Filled 2013-03-08 (×9): qty 1

## 2013-03-08 MED ORDER — ALPRAZOLAM 1 MG PO TABS
1.0000 mg | ORAL_TABLET | Freq: Three times a day (TID) | ORAL | Status: DC | PRN
  Administered 2013-03-09 – 2013-03-11 (×4): 1 mg via ORAL
  Filled 2013-03-08 (×4): qty 1

## 2013-03-08 MED ORDER — ACETAMINOPHEN 325 MG PO TABS: 650.0000 mg | ORAL_TABLET | Freq: Four times a day (QID) | ORAL | Status: DC | PRN

## 2013-03-08 MED ORDER — ALPRAZOLAM 0.5 MG PO TABS
1.0000 mg | ORAL_TABLET | Freq: Once | ORAL | Status: AC
  Administered 2013-03-08: 1 mg via ORAL
  Filled 2013-03-08: qty 2

## 2013-03-08 MED ORDER — VANCOMYCIN HCL IN DEXTROSE 1-5 GM/200ML-% IV SOLN
1000.0000 mg | Freq: Three times a day (TID) | INTRAVENOUS | Status: DC
  Administered 2013-03-08 – 2013-03-10 (×6): 1000 mg via INTRAVENOUS
  Filled 2013-03-08 (×7): qty 200

## 2013-03-08 MED ORDER — VANCOMYCIN HCL 10 G IV SOLR
1250.0000 mg | INTRAVENOUS | Status: AC
  Administered 2013-03-08: 1250 mg via INTRAVENOUS
  Filled 2013-03-08: qty 1250

## 2013-03-08 MED ORDER — HYDROMORPHONE HCL 4 MG PO TABS
8.0000 mg | ORAL_TABLET | ORAL | Status: DC | PRN
  Administered 2013-03-09 – 2013-03-12 (×8): 8 mg via ORAL
  Filled 2013-03-08 (×8): qty 2

## 2013-03-08 MED ORDER — PREDNISONE 5 MG PO TABS
5.0000 mg | ORAL_TABLET | Freq: Two times a day (BID) | ORAL | Status: DC
  Filled 2013-03-08 (×2): qty 1

## 2013-03-08 MED ORDER — ENOXAPARIN SODIUM 40 MG/0.4ML ~~LOC~~ SOLN
40.0000 mg | SUBCUTANEOUS | Status: DC
  Administered 2013-03-08 – 2013-03-11 (×4): 40 mg via SUBCUTANEOUS
  Filled 2013-03-08 (×5): qty 0.4

## 2013-03-08 MED ORDER — AMLODIPINE BESYLATE 5 MG PO TABS
5.0000 mg | ORAL_TABLET | Freq: Every day | ORAL | Status: DC
  Administered 2013-03-10 – 2013-03-12 (×3): 5 mg via ORAL
  Filled 2013-03-08 (×4): qty 1

## 2013-03-08 NOTE — ED Notes (Signed)
Bed: NW29 Expected date:  Expected time:  Means of arrival:  Comments: EMS-infected porta cath

## 2013-03-08 NOTE — ED Notes (Addendum)
Pt daughter gave pt Dilaudid 8mg , PO. After EDP verbalized that it is ok

## 2013-03-08 NOTE — ED Provider Notes (Signed)
CSN: [id number]     Arrival date & time 03/08/13  1030 History   First MD Initiated Contact with Patient 03/08/13 1059     Chief Complaint  Patient presents with  . Vascular Access Problem   (Consider location/radiation/quality/duration/timing/severity/associated sxs/prior Treatment) HPI Comments: 67 year old male with a history of prostate cancer who has a right Port-A-Cath presents with worsening redness and swelling to the site. The daughter who is the POA gives most of the history due to the patient's chronic neurologic condition. She states that this is been gone for about one week. She was told by the home health nurse that he needed to come to the ER immediately to get IV antibiotics and the Port-A-Cath removed. They're seen her 2 days ago for the same for a surgical was consult over the phone and recommended Augmentin and removal later this week. The family denies any systemic symptoms such as fevers, chills, or altered mental status. The patient has had localized pain and worsening erythema superior to the site. He not receive chemotherapy over a month. The daughter states that she is very dissatisfied that he was written "amoxicillin" and did not think she should take it because his artery on Levaquin for other chronic reasons. She presents currently demanding that he be admitted for IV antibiotics until the port to be removed.  The history is provided by a relative.    Past Medical History  Diagnosis Date  . Prostate cancer, primary, with metastasis from prostate to other site 01/05/2013  . HTN (hypertension), benign 01/15/2013  . COPD (chronic obstructive pulmonary disease) 01/15/2013  . IDDM (insulin dependent diabetes mellitus) 01/15/2013  . 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 Cancer associated pain 01/15/2013  . Status post right hip replacement 01/15/2013  . Chronic ulcer of gastric fundus 01/15/2013    EGD 11/24/12  . Depression, reactive 01/15/2013  . Decubitus ulcer     Past Surgical History  Procedure Laterality Date  . Total hip arthroplasty    . Prostate surgery    . Porta cath    . Skin grafts     No family history on file. History  Substance Use Topics  . Smoking status: Current Every Day Smoker -- 1.00 packs/day    Types: Cigarettes  . Smokeless tobacco: Never Used  . Alcohol Use: No    Review of Systems  Constitutional: Negative for fever and chills.  Gastrointestinal: Negative for vomiting.  Skin: Positive for color change and wound.  Psychiatric/Behavioral: Negative for confusion.  All other systems reviewed and are negative.    Allergies  Aspirin; Codeine; and Lactose intolerance (gi)  Home Medications   Current Outpatient Rx  Name  Route  Sig  Dispense  Refill  . abiraterone Acetate (ZYTIGA) 250 MG tablet   Oral   Take 4 tablets (1,000 mg total) by mouth daily. Take on an empty stomach 1 hour before or 2 hours after a meal   120 tablet   3     Script given to [facility] 01/15/16   . ALPRAZolam (XANAX) 1 MG tablet   Oral   Take 1 tablet (1 mg total) by mouth 3 (three) times daily as needed for sleep or anxiety.   90 tablet   2     Script given in office visit 01/14/13   . amLODipine (NORVASC) 5 MG tablet   Oral   Take 1 tablet (5 mg total) by mouth daily.   30 tablet   4   .  amoxicillin-clavulanate (AUGMENTIN) 875-125 MG per tablet   Oral   Take 1 tablet by mouth every 12 (twelve) hours.   14 tablet   0   . HYDROmorphone (DILAUDID) 8 MG tablet   Oral   Take 1 tablet (8 mg total) by mouth every 4 (four) hours as needed for pain.   180 tablet   0     Script given in office visit 01/14/13   . morphine (MS CONTIN) 60 MG 12 hr tablet   Oral   Take 1 tablet (60 mg total) by mouth 3 (three) times daily after meals.   90 tablet   0     See documentation note for today from dtr and phar ...   . ondansetron (ZOFRAN ODT) 8 MG disintegrating tablet   Oral   Take 1 tablet (8 mg total) by mouth every 8 (eight) hours  as needed for nausea.   20 tablet   6   . predniSONE (DELTASONE) 5 MG tablet   Oral   Take 1 tablet (5 mg total) by mouth 2 (two) times daily. Takes 5mg  twice a day   120 tablet   3    BP 149/75  Pulse 62  Temp(Src) 98.3 F (36.8 C) (Oral)  Resp 16  SpO2 96% Physical Exam  Nursing note and vitals reviewed. Constitutional: He is oriented to person, place, and time. He appears well-developed and well-nourished. No distress.  HENT:  Head: Normocephalic and atraumatic.  Right Ear: External ear normal.  Left Ear: External ear normal.  Nose: Nose normal.  Eyes: Right eye exhibits no discharge. Left eye exhibits no discharge.  Neck: Neck supple.  Cardiovascular: Normal rate, regular rhythm, normal heart sounds and intact distal pulses.   Pulmonary/Chest: Effort normal and breath sounds normal.    Abdominal: Soft. There is no tenderness.  Musculoskeletal: He exhibits no edema.  Neurological: He is alert and oriented to person, place, and time.  Skin: Skin is warm and dry.    ED Course  Procedures (including critical care time) Labs Review Labs Reviewed  CBC WITH DIFFERENTIAL - Abnormal; Notable for the following:    Hemoglobin 12.5 (*)    HCT 38.8 (*)    RDW 17.8 (*)    Neutrophils Relative % 79 (*)    All other components within normal limits  BASIC METABOLIC PANEL - Abnormal; Notable for the following:    Glucose, Bld 122 (*)    GFR calc non Af Amer 88 (*)    All other components within normal limits  CULTURE, BLOOD (ROUTINE X 2)  CULTURE, BLOOD (ROUTINE X 2)   Imaging Review Dg Chest 2 View  03/06/2013   CLINICAL DATA:  Chest pain. History of hypertension and COPD.  EXAM: CHEST  2 VIEW  COMPARISON:  None.  FINDINGS: Cardiac silhouette is normal in size and configuration. No mediastinal or hilar masses.  The lungs are mildly hyperexpanded.  There are thickened interstitial markings diffusely. This may all be chronic. A superimposed infiltrate is not excluded,  however. There are no focal areas of airspace consolidation. No pleural effusion or pneumothorax is seen.  Right anterior chest wall power Port-A-Cath has its tip in the lower superior vena cava.  The bony thorax is demineralized but intact.  IMPRESSION: Diffusely thickened interstitial markings as well as lung hyperexpansion. This may all be chronic. A superimposed acute interstitial infiltrate is not excluded but felt less likely.   Electronically Signed   By: Amie Portland  On: 03/06/2013 21:05    MDM   1. Infection due to portacath, initial encounter    Patient's daughter is insistent on IV abx, though she never let patient attempt po abx as outpatient. He is well appearing and has normal vitals. D/w Dr. Gerrit Friends who states there is no indication for acute surgery at this time, will give trial of IV abx and admit to hospitalist with inpatient surgical consult.     Audree Camel, MD 03/08/13 [phone]

## 2013-03-08 NOTE — ED Notes (Signed)
Per Dr Criss Alvine, holding draw on 2nd BC's because surgeon may want them drawn from port.

## 2013-03-08 NOTE — ED Notes (Signed)
Pt from home c/o infected port. Pt reports that he was seen at this facility 2 days ago for the same, was given abx, but is still in severe pain with swelling to site. Pt family at bedside. Pt is A&O and in NAD. Pt has hx of prostate CA with metastasis.

## 2013-03-08 NOTE — ED Notes (Signed)
Pt from home states that he was told by his home health RN told him that his infected port needed "emergency surgery". Pt states that he was given an abx from this facility Friday, but has not been taking it because he knows that it is "a weak antibiotic." Pt daughter reports that he is taking Levaquin that he already had. Pt reports N/V/D/F for a "long time" but thinks it may be from his PO chemo. Pt states that he has been unable to eat since this infection has started. Pt R port site is open with some white, oozing. Pt also has redness above the port site to the R clavicle area. Pt A&O and in NAD.

## 2013-03-08 NOTE — H&P (Addendum)
[facility] History and Physical  Jack Barrett:[id number] DOB: 09/23/1945 DOA: 03/08/2013  Referring physician: EDP PCP: No PCP Per Patient  Specialists: Onc-Dr.Granfortuna  Chief Complaint: port site infection  HPI: Jack Barrett is a 67 y.o. male with PMH of Metastatic Prostate CA, currently on Zytiga, DM, COPD, dysphagia, failure to thrive was brought to [place] ER 2 days ago per [facility] RN request due to purulent drainage from port site, he was discharged home on Augmentin with advise to FU with Surgery. She didn't give him the Augmentin since he was taking some other abx, daughter unclear on details. His daughter brings him back to the ER again today for the same complaint, she reports increased pain at the site of port. He is a very poor historian. He denies any fevers, daughter reports overall gradual decline, physically cognitively over the past several months. After discussions between EDP/Oncology/Surgery, [facility] was consulted for admission, daughter adamantly refused to take him home without port removal.   Review of Systems: positive for dysphagia, chronic pain The patient denies anorexia, fever, weight loss,, vision loss, decreased hearing, hoarseness, chest pain, syncope, dyspnea on exertion, peripheral edema, balance deficits, hemoptysis, abdominal pain, melena, hematochezia, severe indigestion/heartburn, hematuria, incontinence, genital sores, muscle weakness, suspicious skin lesions, transient blindness, difficulty walking, depression, unusual weight change, abnormal bleeding, enlarged lymph nodes, angioedema, and breast masses.    Past Medical History  Diagnosis Date  . Prostate cancer, primary, with metastasis from prostate to other site 01/05/2013  . HTN (hypertension), benign 01/15/2013  . COPD (chronic obstructive pulmonary disease) 01/15/2013  . IDDM (insulin dependent diabetes mellitus) 01/15/2013  . 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 Cancer  associated pain 01/15/2013  . Status post right hip replacement 01/15/2013  . Chronic ulcer of gastric fundus 01/15/2013    EGD 11/24/12  . Depression, reactive 01/15/2013  . Decubitus ulcer    Past Surgical History  Procedure Laterality Date  . Total hip arthroplasty    . Prostate surgery    . Porta cath    . Skin grafts     Social History:  reports that he has been smoking Cigarettes.  He has been smoking about 1.00 pack per day. He has never used smokeless tobacco. He reports that he does not drink alcohol or use illicit drugs. Lives at home with daughter, ambulates using walker  Allergies  Allergen Reactions  . Aspirin Other (See Comments)    hemmoriage  . Codeine Other (See Comments)    hallucinations  . Lactose Intolerance (Gi) Diarrhea    No family history on file.   Prior to Admission medications   Medication Sig Start Date End Date Taking? Authorizing Provider  abiraterone Acetate (ZYTIGA) 250 MG tablet Take 4 tablets (1,000 mg total) by mouth daily. Take on an empty stomach 1 hour before or 2 hours after a meal 01/14/13  Yes [name], MD  ALPRAZolam Prudy Feeler) 1 MG tablet Take 1 tablet (1 mg total) by mouth 3 (three) times daily as needed for sleep or anxiety. 01/14/13  Yes [name], MD  amLODipine (NORVASC) 5 MG tablet Take 1 tablet (5 mg total) by mouth daily. 01/14/13  Yes [name], MD  calcium carbonate (OS-CAL) 600 MG TABS tablet Take 600 mg by mouth daily with breakfast.   Yes Historical Provider, MD  cholecalciferol (VITAMIN D) 1000 UNITS tablet Take 1,000 Units by mouth daily.   Yes Historical Provider, MD  HYDROmorphone (DILAUDID) 8 MG tablet  Take 1 tablet (8 mg total) by mouth every 4 (four) hours as needed for pain. 02/04/13  Yes [name], MD  morphine (MS CONTIN) 60 MG 12 hr tablet Take 1 tablet (60 mg total) by mouth 3 (three) times daily after meals. 02/12/13  Yes [name], MD  ondansetron (ZOFRAN ODT) 8 MG disintegrating  tablet Take 1 tablet (8 mg total) by mouth every 8 (eight) hours as needed for nausea. 01/14/13  Yes [name], MD  predniSONE (DELTASONE) 5 MG tablet Take 1 tablet (5 mg total) by mouth 2 (two) times daily. Takes 5mg  twice a day 01/14/13  Yes [name], MD  amoxicillin-clavulanate (AUGMENTIN) 875-125 MG per tablet Take 1 tablet by mouth every 12 (twelve) hours. 03/06/13   Toy Baker, MD   Physical Exam: Filed Vitals:   03/08/13 1042  BP: 149/75  Pulse: 62  Temp: 98.3 F (36.8 C)  Resp: 16     General: frail, unkempt, looks older than stated age  HEENT: PERRLA, muddy sclera, hyperpigmented areas on buccal mucosa and palate  Cardiovascular: S1S2/RRR  Chest: Port in  R chest with <1cm open wound, expresses pus, small raised area, erythematous area supero-medial to this, tender to palpation  Respiratory: diminished BS at bases  Abdomen: soft, NT, ND, BS present  Skin: no rahses, dry and scaly  Musculoskeletal: no edema c/c  Psychiatric: appropriate mood and affect  Neurologic: strength 5-/% bilaterally  Labs on Admission:  Basic Metabolic Panel:  Recent Labs Lab 03/08/13 1230  NA 135  K 4.0  CL 99  CO2 25  GLUCOSE 122*  BUN 9  CREATININE 0.91  CALCIUM 8.7   Liver Function Tests: No results found for this basename: AST, ALT, ALKPHOS, BILITOT, PROT, ALBUMIN,  in the last 168 hours No results found for this basename: LIPASE, AMYLASE,  in the last 168 hours No results found for this basename: AMMONIA,  in the last 168 hours CBC:  Recent Labs Lab 03/08/13 1150  WBC 7.8  NEUTROABS 6.1  HGB 12.5*  HCT 38.8*  MCV 87.6  PLT 195   Cardiac Enzymes: No results found for this basename: CKTOTAL, CKMB, CKMBINDEX, TROPONINI,  in the last 168 hours  BNP (last 3 results) No results found for this basename: PROBNP,  in the last 8760 hours CBG: No results found for this basename: GLUCAP,  in the last 168 hours  Radiological Exams on  Admission: Dg Chest 2 View  03/06/2013   CLINICAL DATA:  Chest pain. History of hypertension and COPD.  EXAM: CHEST  2 VIEW  COMPARISON:  None.  FINDINGS: Cardiac silhouette is normal in size and configuration. No mediastinal or hilar masses.  The lungs are mildly hyperexpanded.  There are thickened interstitial markings diffusely. This may all be chronic. A superimposed infiltrate is not excluded, however. There are no focal areas of airspace consolidation. No pleural effusion or pneumothorax is seen.  Right anterior chest wall power Port-A-Cath has its tip in the lower superior vena cava.  The bony thorax is demineralized but intact.  IMPRESSION: Diffusely thickened interstitial markings as well as lung hyperexpansion. This may all be chronic. A superimposed acute interstitial infiltrate is not excluded but felt less likely.   Electronically Signed   By: Amie Portland   On: 03/06/2013 21:05    Assessment/Plan  1.  Infection due to portacath - localized with purulent discharge, no evidence of systemic infection at this time. - start IV Vancomycin, FU  blood Cx drawn in ER - Surgery consulted by EDP for port removal - This was placed at [facility] in [place] in April per daughter  2. DM -SSI  3. Prostate cancer, primary, with extensive bony metastasis/chronic pain  -continue Zytiga, followed by Dr.Granfortuna - continue narcotics based on home regimen  4. Dysphagia -check swallow eval -Mechanical soft diet for now  5. COPD  -stable  DVt proph: lovenox  Code Status: Full Code Family Communication: d/w daughter at bedside Disposition Plan: inpatient  Time spent:  [facility] Pager [phone]  If 7PM-7AM, please contact night-coverage [email or web address] Password [facility] 03/08/2013, 2:18 PM

## 2013-03-08 NOTE — Consult Note (Signed)
[facility]  Reason for Consult: infusion port infection  Referring Physician: Dr. Criss Alvine, Emergency Department, [facility]  [facility] is an 67 y.o. male.   HPI: patient is a 67 year old male with known metastatic prostate cancer. The patient had apparently been undergoing treatment in [place]. He was recently relocated to [place] by his daughter and is under the care of [facility] at the [facility]. Patient presents to the emergency department with drainage at the site of his Port-A-Cath. He is not febrile. There is no cellulitis. White blood cell count is normal.  Port-A-Cath was placed by a surgeon at an outside medical facility, presumably in [place]. Patient has not previously been evaluated by anyone in our practice. Today I am asked to evaluate for possible Port-A-Cath removal. Medical service is planning admission for intravenous antibiotics.  Limited history is available. Patient states that the Port-A-Cath has not been used recently.  Past Medical History  Diagnosis Date  . Prostate cancer, primary, with metastasis from prostate to other site 01/05/2013  . HTN (hypertension), benign 01/15/2013  . COPD (chronic obstructive pulmonary disease) 01/15/2013  . IDDM (insulin dependent diabetes mellitus) 01/15/2013  . 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 Cancer associated pain 01/15/2013  . Status post right hip replacement 01/15/2013  . Chronic ulcer of gastric fundus 01/15/2013    EGD 11/24/12  . Depression, reactive 01/15/2013  . Decubitus ulcer     Past Surgical History  Procedure Laterality Date  . Total hip arthroplasty    . Prostate surgery    . Porta cath    . Skin grafts      No family history on file.  Social History:  reports that he has been smoking Cigarettes.  He has been smoking about 1.00 pack per day. He has never used smokeless tobacco. He  reports that he does not drink alcohol or use illicit drugs.  Allergies:  Allergies  Allergen Reactions  . Aspirin Other (See Comments)    hemmoriage  . Codeine Other (See Comments)    hallucinations  . Lactose Intolerance (Gi) Diarrhea    Medications: I have reviewed the patient's current medications.  Results for orders placed during [facility] encounter of 03/08/13 (from the past 48 hour(s))  CBC WITH DIFFERENTIAL     Status: Abnormal   Collection Time    03/08/13 11:50 AM      Result Value Range   WBC 7.8  4.0 - 10.5 K/uL   RBC 4.43  4.22 - 5.81 MIL/uL   Hemoglobin 12.5 (*) 13.0 - 17.0 g/dL   HCT 16.1 (*) 09.6 - 04.5 %   MCV 87.6  78.0 - 100.0 fL   MCH 28.2  26.0 - 34.0 pg   MCHC 32.2  30.0 - 36.0 g/dL   RDW 40.9 (*) 81.1 - 91.4 %   Platelets 195  150 - 400 K/uL   Comment: REPEATED TO VERIFY     SPECIMEN CHECKED FOR CLOTS     PLATELET COUNT CONFIRMED BY SMEAR   Neutrophils Relative % 79 (*) 43 - 77 %   Neutro Abs 6.1  1.7 - 7.7 K/uL   Lymphocytes Relative 12  12 - 46 %   Lymphs Abs 0.9  0.7 - 4.0 K/uL   Monocytes Relative 8  3 - 12 %   Monocytes Absolute 0.6  0.1 - 1.0 K/uL   Eosinophils Relative 1  0 - 5 %   Eosinophils Absolute 0.1  0.0 - 0.7 K/uL   Basophils Relative 0  0 - 1 %   Basophils Absolute 0.0  0.0 - 0.1 K/uL  BASIC METABOLIC PANEL     Status: Abnormal   Collection Time    03/08/13 12:30 PM      Result Value Range   Sodium 135  135 - 145 mEq/L   Potassium 4.0  3.5 - 5.1 mEq/L   Chloride 99  96 - 112 mEq/L   CO2 25  19 - 32 mEq/L   Glucose, Bld 122 (*) 70 - 99 mg/dL   BUN 9  6 - 23 mg/dL   Creatinine, Ser 2.09  0.50 - 1.35 mg/dL   Calcium 8.7  8.4 - 47.0 mg/dL   GFR calc non Af Amer 88 (*) >90 mL/min   GFR calc Af Amer >90  >90 mL/min   Comment: (NOTE)     The eGFR has been calculated using the CKD EPI equation.     This calculation has not been validated in all clinical situations.     eGFR's persistently <90 mL/min signify possible Chronic  Kidney     Disease.    Dg Chest 2 View  03/06/2013   CLINICAL DATA:  Chest pain. History of hypertension and COPD.  EXAM: CHEST  2 VIEW  COMPARISON:  None.  FINDINGS: Cardiac silhouette is normal in size and configuration. No mediastinal or hilar masses.  The lungs are mildly hyperexpanded.  There are thickened interstitial markings diffusely. This may all be chronic. A superimposed infiltrate is not excluded, however. There are no focal areas of airspace consolidation. No pleural effusion or pneumothorax is seen.  Right anterior chest wall power Port-A-Cath has its tip in the lower superior vena cava.  The bony thorax is demineralized but intact.  IMPRESSION: Diffusely thickened interstitial markings as well as lung hyperexpansion. This may all be chronic. A superimposed acute interstitial infiltrate is not excluded but felt less likely.   Electronically Signed   By: Amie Portland   On: 03/06/2013 21:05    Review of Systems  Constitutional: Positive for weight loss and malaise/fatigue. Negative for fever and chills.  HENT: Negative.   Eyes: Negative.   Respiratory: Negative.   Cardiovascular: Negative.   Gastrointestinal: Negative.   Genitourinary: Negative.   Musculoskeletal: Negative.   Skin:       Drainage at port site  Neurological: Positive for weakness.  Endo/Heme/Allergies: Negative.   Psychiatric/Behavioral: Negative.    Blood pressure 149/75, pulse 62, temperature 98.3 F (36.8 C), temperature source Oral, resp. rate 16, SpO2 96.00%. Physical Exam  Constitutional: He is oriented to person, place, and time. He appears well-developed. No distress.  HENT:  Head: Normocephalic and atraumatic.  Right Ear: External ear normal.  Left Ear: External ear normal.  Eyes: Pupils are equal, round, and reactive to light. No scleral icterus.  Neck: Normal range of motion. Neck supple. No thyromegaly present.  Tenderness in right supraclavicular fossa; no mass; no nodes; no fluctuence   Cardiovascular: Normal rate, regular rhythm and normal heart sounds.   Respiratory: Effort normal and breath sounds normal. No respiratory distress.  Rhonchi right > left Breakdown of incision at port-a-cath site right upper chest wall with purulent drainage and tenderness.  No significant cellulitis.  Tenderness along subcutaneous catheter tract without fluctuence.  GI: Soft. He exhibits no distension.  Musculoskeletal: Normal range of motion. He exhibits no edema.  Lymphadenopathy:  He has no cervical adenopathy.  Neurological: He is alert and oriented to person, place, and time.  Skin: Skin is warm and dry.  Psychiatric: He has a normal mood and affect. His behavior is normal.    Assessment/Plan: Port-a-cath infection with wound breakdown  Port-a-cath is not salvageable and will need to be removed.  Agree with admission and IV abx's per medical service.  When medically stable, will plan removal of port-a-cath in operating room under sedation.  Wound will need to be packed open and dressing changes performed post-operatively by nursing staff.  Will ask Dr. Cyndie Chime to evaluate and approved of port-a-cath removal prior to surgery  Dr. Chevis Pretty will be at [facility] for our practice this week and will most likely perform this procedure when the patient is prepared for surgery.  Thank you for the consult request.  Velora Heckler, MD, [facility] Office: [phone]   Pammy Vesey Judie Petit 03/08/2013, 2:48 PM

## 2013-03-08 NOTE — ED Notes (Signed)
Called pharmacy for status on pt vanc. They are working on it and will send ASAP

## 2013-03-08 NOTE — ED Notes (Signed)
Pt daughter stated to this writer that she did not refuse for blood cultures to be drawn she just requested that he get his "aprazolam for sedation" because the pt has had multiple sticks.  Dr Criss Alvine consulted and agreed to give Aprazolam per daughter's request

## 2013-03-08 NOTE — ED Notes (Signed)
Lab called and stated that BMP was hemolyzed, recollecting

## 2013-03-08 NOTE — Progress Notes (Signed)
ANTIBIOTIC CONSULT NOTE - INITIAL  Pharmacy Consult for Vancomycin Indication: Port-a-cath infection  Allergies  Allergen Reactions  . Aspirin Other (See Comments)    hemmoriage  . Codeine Other (See Comments)    hallucinations  . Lactose Intolerance (Gi) Diarrhea    Patient Measurements: Ht: 79in Wt: 83.5kg  Vital Signs: Temp: 98.3 F (36.8 C) (09/28 1042) Temp src: Oral (09/28 1042) BP: 139/107 mmHg (09/28 1453) Pulse Rate: 58 (09/28 1453) Intake/Output from previous day:   Intake/Output from this shift:    Labs:  Recent Labs  03/08/13 1150 03/08/13 1230  WBC 7.8  --   HGB 12.5*  --   PLT 195  --   CREATININE  --  0.91   CrCl 82 ml/min/1.21m2 (normalized)  No results found for this basename: VANCOTROUGH, VANCOPEAK, VANCORANDOM, GENTTROUGH, GENTPEAK, GENTRANDOM, TOBRATROUGH, TOBRAPEAK, TOBRARND, AMIKACINPEAK, AMIKACINTROU, AMIKACIN,  in the last 72 hours   Microbiology: No results found for this or any previous visit (from the past 720 hour(s)).  Medical History: Past Medical History  Diagnosis Date  . Prostate cancer, primary, with metastasis from prostate to other site 01/05/2013  . HTN (hypertension), benign 01/15/2013  . COPD (chronic obstructive pulmonary disease) 01/15/2013  . IDDM (insulin dependent diabetes mellitus) 01/15/2013  . 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 Cancer associated pain 01/15/2013  . Status post right hip replacement 01/15/2013  . Chronic ulcer of gastric fundus 01/15/2013    EGD 11/24/12  . Depression, reactive 01/15/2013  . Decubitus ulcer     Medications:  Scheduled:  . abiraterone Acetate  1,000 mg Oral Daily  . amLODipine  5 mg Oral Daily  . enoxaparin (LOVENOX) injection  40 mg Subcutaneous Q24H  . insulin aspart  0-9 Units Subcutaneous TID WC  . morphine  60 mg Oral TID PC  . vancomycin  1,250 mg Intravenous STAT   Infusions:   Assessment: 67 year old male with known metastatic prostate cancer presents with  drainage at the site of his Port-a-cath. Surgery consulted for possible removal. Ordered to begin vancomycin per pharmacy protocol.  Goal of Therapy:  Vancomycin trough level 15-20 mcg/ml   Plan:   Vancomycin 1250mg  IV given in ER, then 1g IV q8h Check trough at steady state Follow up renal function & cultures  Loralee Pacas, PharmD, BCPS Pager: [phone] 03/08/2013,3:04 PM

## 2013-03-08 NOTE — ED Notes (Signed)
Attempted to collect blood from patient and family is refusing to let us make any more attempts.

## 2013-03-09 ENCOUNTER — Encounter (HOSPITAL_COMMUNITY): Payer: Self-pay | Admitting: Anesthesiology

## 2013-03-09 ENCOUNTER — Encounter (HOSPITAL_COMMUNITY): Payer: Self-pay | Admitting: Certified Registered Nurse Anesthetist

## 2013-03-09 ENCOUNTER — Telehealth: Payer: Self-pay | Admitting: *Deleted

## 2013-03-09 ENCOUNTER — Encounter (HOSPITAL_COMMUNITY)
Admission: EM | Disposition: A | Discharge status: Home Health | Payer: Self-pay | Source: Home / Self Care | Attending: Internal Medicine

## 2013-03-09 ENCOUNTER — Inpatient Hospital Stay (HOSPITAL_COMMUNITY): Payer: Medicare Other | Admitting: Certified Registered Nurse Anesthetist

## 2013-03-09 DIAGNOSIS — R627 Adult failure to thrive: Secondary | ICD-10-CM

## 2013-03-09 HISTORY — PX: PORT-A-CATH REMOVAL: SHX5289

## 2013-03-09 LAB — GLUCOSE, CAPILLARY
Glucose-Capillary: 84 mg/dL (ref 70–99)
Glucose-Capillary: 114 mg/dL — ABNORMAL HIGH (ref 70–99)

## 2013-03-09 LAB — HEMOGLOBIN A1C
Hgb A1c MFr Bld: 6.6 % — ABNORMAL HIGH (ref <5.7)
Mean Plasma Glucose: 143 mg/dL — ABNORMAL HIGH (ref <117)

## 2013-03-09 LAB — COMPREHENSIVE METABOLIC PANEL
ALT: <5 U/L (ref 0–53)
AST: 7 U/L (ref 0–37)
Albumin: 2.3 g/dL — ABNORMAL LOW (ref 3.5–5.2)
Alkaline Phosphatase: 102 U/L (ref 39–117)
BUN: 15 mg/dL (ref 6–23)
Calcium: 8.3 mg/dL — ABNORMAL LOW (ref 8.4–10.5)
Chloride: 102 mEq/L (ref 96–112)
GFR calc Af Amer: 82 mL/min — ABNORMAL LOW (ref >90)
Glucose, Bld: 149 mg/dL — ABNORMAL HIGH (ref 70–99)
Sodium: 136 mEq/L (ref 135–145)
Total Protein: 5.6 g/dL — ABNORMAL LOW (ref 6.0–8.3)

## 2013-03-09 LAB — CBC
Hemoglobin: 11.5 g/dL — ABNORMAL LOW (ref 13.0–17.0)
MCH: 28.1 pg (ref 26.0–34.0)
MCHC: 32.2 g/dL (ref 30.0–36.0)
Platelets: 137 10*3/uL — ABNORMAL LOW (ref 150–400)
RDW: 17.2 % — ABNORMAL HIGH (ref 11.5–15.5)

## 2013-03-09 LAB — SURGICAL PCR SCREEN: Staphylococcus aureus: NEGATIVE

## 2013-03-09 SURGERY — REMOVAL PORT-A-CATH
Anesthesia: General | Laterality: Right | Wound class: Contaminated

## 2013-03-09 MED ORDER — BUPIVACAINE-EPINEPHRINE 0.25% -1:200000 IJ SOLN
INTRAMUSCULAR | Status: DC | PRN
  Administered 2013-03-09: 10 mL

## 2013-03-09 MED ORDER — FENTANYL CITRATE 0.05 MG/ML IJ SOLN
INTRAMUSCULAR | Status: DC | PRN
  Administered 2013-03-09: 25 ug via INTRAVENOUS

## 2013-03-09 MED ORDER — LIDOCAINE HCL (CARDIAC) 20 MG/ML IV SOLN
INTRAVENOUS | Status: DC | PRN
  Administered 2013-03-09: 100 mg via INTRAVENOUS

## 2013-03-09 MED ORDER — FENTANYL CITRATE 0.05 MG/ML IJ SOLN: 25.0000 ug | INTRAMUSCULAR | Status: DC | PRN

## 2013-03-09 MED ORDER — GLYCOPYRROLATE 0.2 MG/ML IJ SOLN
INTRAMUSCULAR | Status: DC | PRN
  Administered 2013-03-09: 0.2 mg via INTRAVENOUS

## 2013-03-09 MED ORDER — BUPIVACAINE-EPINEPHRINE PF 0.25-1:200000 % IJ SOLN
INTRAMUSCULAR | Status: AC
  Filled 2013-03-09: qty 30

## 2013-03-09 MED ORDER — PROPOFOL 10 MG/ML IV BOLUS
INTRAVENOUS | Status: DC | PRN
  Administered 2013-03-09: 200 mg via INTRAVENOUS

## 2013-03-09 MED ORDER — ONDANSETRON HCL 4 MG/2ML IJ SOLN
INTRAMUSCULAR | Status: DC | PRN
  Administered 2013-03-09: 4 mg via INTRAVENOUS

## 2013-03-09 MED ORDER — LACTATED RINGERS IV SOLN
INTRAVENOUS | Status: DC
  Administered 2013-03-09: 1000 mL via INTRAVENOUS

## 2013-03-09 MED ORDER — PROMETHAZINE HCL 25 MG/ML IJ SOLN: 6.2500 mg | INTRAMUSCULAR | Status: DC | PRN

## 2013-03-09 SURGICAL SUPPLY — 34 items
ADH SKN CLS APL DERMABOND .7 (GAUZE/BANDAGES/DRESSINGS) ×1
BLADE HEX COATED 2.75 (ELECTRODE) ×2 IMPLANT
BLADE SURG 15 STRL LF DISP TIS (BLADE) ×1 IMPLANT
BLADE SURG 15 STRL SS (BLADE) ×2
CANISTER SUCTION 2500CC (MISCELLANEOUS) ×2 IMPLANT
CLOTH BEACON ORANGE TIMEOUT ST (SAFETY) ×2 IMPLANT
DECANTER SPIKE VIAL GLASS SM (MISCELLANEOUS) ×2 IMPLANT
DERMABOND ADVANCED (GAUZE/BANDAGES/DRESSINGS) ×1
DERMABOND ADVANCED .7 DNX12 (GAUZE/BANDAGES/DRESSINGS) ×1 IMPLANT
DRAPE LAPAROTOMY TRNSV 102X78 (DRAPE) ×2 IMPLANT
ELECT REM PT RETURN 9FT ADLT (ELECTROSURGICAL) ×2
ELECTRODE REM PT RTRN 9FT ADLT (ELECTROSURGICAL) ×1 IMPLANT
GLOVE BIO SURGEON STRL SZ7.5 (GLOVE) ×2 IMPLANT
GLOVE BIOGEL PI IND STRL 7.0 (GLOVE) ×1 IMPLANT
GLOVE BIOGEL PI INDICATOR 7.0 (GLOVE) ×1
GOWN PREVENTION PLUS LG XLONG (DISPOSABLE) ×2 IMPLANT
GOWN PREVENTION PLUS XLARGE (GOWN DISPOSABLE) ×2 IMPLANT
GOWN STRL REIN XL XLG (GOWN DISPOSABLE) ×2 IMPLANT
KIT BASIN OR (CUSTOM PROCEDURE TRAY) ×2 IMPLANT
NDL HYPO 25X1 1.5 SAFETY (NEEDLE) ×1 IMPLANT
NEEDLE HYPO 22GX1.5 SAFETY (NEEDLE) IMPLANT
NEEDLE HYPO 25X1 1.5 SAFETY (NEEDLE) ×2 IMPLANT
PACK BASIC VI WITH GOWN DISP (CUSTOM PROCEDURE TRAY) ×2 IMPLANT
PENCIL BUTTON HOLSTER BLD 10FT (ELECTRODE) ×2 IMPLANT
SPONGE GAUZE 4X4 12PLY (GAUZE/BANDAGES/DRESSINGS) ×1 IMPLANT
SPONGE LAP 4X18 X RAY DECT (DISPOSABLE) ×2 IMPLANT
SUT MNCRL AB 4-0 PS2 18 (SUTURE) ×2 IMPLANT
SUT VIC AB 3-0 SH 27 (SUTURE)
SUT VIC AB 3-0 SH 27XBRD (SUTURE) IMPLANT
SYR BULB IRRIGATION 50ML (SYRINGE) IMPLANT
SYR CONTROL 10ML LL (SYRINGE) ×2 IMPLANT
TAPE CLOTH SURG 4X10 WHT LF (GAUZE/BANDAGES/DRESSINGS) ×1 IMPLANT
TOWEL OR 17X26 10 PK STRL BLUE (TOWEL DISPOSABLE) ×2 IMPLANT
YANKAUER SUCT BULB TIP 10FT TU (MISCELLANEOUS) IMPLANT

## 2013-03-09 NOTE — Progress Notes (Signed)
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 He moved here from [place] to be with his daughter. He has prostate cancer metastatic to bone. A Port-A-Cath infusion device was placed when he was still in [place] and chemotherapy was initiated. At time of presentation in [place], performance status was extremely poor. I elected not to keep him on chemotherapy. I changed him to oral hormonal therapy with Zytiga. Please see my 01/14/2013 office note for complete details of his previous medical history. He tolerated the Zytiga very well. Performance status improved at time of August 27 office visit. He now presents when his daughter noted purulent drainage from the Port-A-Cath site. He reports low-grade fevers. He is currently on antibiotics and has been afebrile since hospital admission.  On exam, there is breakdown of the skin overlying the Port-A-Cath device and scant purulent drainage. The device is exposed. No other focal findings on exam.  Impression Port-A-Cath infection and dehiscence of skin overlying the device.  Recommendation: Proceed with removal of the catheter and continue parenteral antibiotics. Since we are not administering chemotherapy at this time, there is no indication to place another device at present.

## 2013-03-09 NOTE — Progress Notes (Signed)
[facility]  Patient Status: Active (receiving services up to time of hospitalization)  [facility] is providing the following services: RN, PT, OT, MSW and HHA  If patient discharges after hours, please call [phone].   [facility] 03/09/2013, 11:13 AM

## 2013-03-09 NOTE — Anesthesia Postprocedure Evaluation (Signed)
  Anesthesia Post-op Note  Patient: [facility]  Procedure(s) Performed: Procedure(s) (LRB): REMOVAL PORT-A-CATH (Right)  Patient Location: PACU  Anesthesia Type: General  Level of Consciousness: awake and alert   Airway and Oxygen Therapy: Patient Spontanous Breathing  Post-op Pain: mild  Post-op Assessment: Post-op Vital signs reviewed, Patient's Cardiovascular Status Stable, Respiratory Function Stable, Patent Airway and No signs of Nausea or vomiting  Last Vitals:  Filed Vitals:   03/09/13 1130  BP: 158/70  Pulse: 58  Temp: 36.9 C  Resp: 14    Post-op Vital Signs: stable   Complications: No apparent anesthesia complications

## 2013-03-09 NOTE — OR Nursing (Signed)
Notified Dr. Carolynne Edouard that lab called to state they are unable to do anaerobic culture on the catheter tip.  No new orders received.

## 2013-03-09 NOTE — Evaluation (Signed)
Clinical/Bedside Swallow Evaluation Patient Details  Name: [facility] MRN: [id number] Date of Birth: 1945-08-08  Today's Date: 03/09/2013 Time: 4098-1191 SLP Time Calculation (min): 17 min  Past Medical History:  Past Medical History  Diagnosis Date  . Prostate cancer, primary, with metastasis from prostate to other site 01/05/2013  . HTN (hypertension), benign 01/15/2013  . COPD (chronic obstructive pulmonary disease) 01/15/2013  . IDDM (insulin dependent diabetes mellitus) 01/15/2013  . 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 Cancer associated pain 01/15/2013  . Status post right hip replacement 01/15/2013  . Chronic ulcer of gastric fundus 01/15/2013    EGD 11/24/12  . Depression, reactive 01/15/2013  . Decubitus ulcer    Past Surgical History:  Past Surgical History  Procedure Laterality Date  . Total hip arthroplasty    . Prostate surgery    . Porta cath    . Skin grafts     HPI:  67 yo male adm to [facility] with infected port-a-cath site.  Pt has prostate cancer with mets to bone.  He is undergoing tx.  Per chart review, pt has h/o dysphagia- pt denies this but daughter admits to pt becoming "choked" on food starting approximately 5 months ago.   Pt required heimlich manuever from shrimp aspiration recently - Daughter reports that the shrimp the pt expectorated was whole *had not been masticated. She denies issues with pt swallowing liquids.  Pt today underwent portacath removal and is now on a clear liquid diet.  Pt was found in room eating partially reclined due to pain from decub.     Assessment / Plan / Recommendation Clinical Impression  Limited evaluation completed due to limited pt participation and current diet restrictions.  Pt eating lunch with HOB reclined due to decub ulcer - intake of coffee observed only.  No overt s/s of aspiration with intake SLP observed  - mild delay in swallow initiation.   Rec to continue diet and when ready to advance return to soft diet  *given aspiration episode on shrimp PTA.  Also note pt with h/o chronic gastric ulcer at fundus s/p EGD 11/2012 (did not locate results in epic), recommend general esophageal/reflux precautions.  Given findings of ulcer, ? esophageal issues contributing to pt's dysphagia symptoms.  Daughter reports pt eats small amounts throughout the day at home but continues with weight loss.  SLP to follow up x1 to assure tolerance and aspiration mitigation strategies are in place.  Skilled intervention included educating pt and daughter to aspiration precautions including timing pain medication to achieve maximal benefit to allow po but not cause drowsiness.  Also recommend pt eat soft foods at home - which daughter agreed.  Thanks for the order.     Aspiration Risk  Moderate (due to positioning, weakness, ? pain medicine impact)    Diet Recommendation Dysphagia 3 (Mechanical Soft);Thin liquid   Liquid Administration via: Cup;Straw Medication Administration: Whole meds with liquid Supervision: Patient able to self feed Compensations: Slow rate;Small sips/bites Postural Changes and/or Swallow Maneuvers: Upright 30-60 min after meal (upright as able)    Other  Recommendations Oral Care Recommendations: Oral care BID   Follow Up Recommendations       Frequency and Duration min 1 x/week  1 week   Pertinent Vitals/Pain Afebrile, decreased    SLP Swallow Goals Patient will utilize recommended strategies during swallow to increase swallowing safety with: Minimal assistance   Swallow Study Prior Functional Status   see clinical impressions    General  Date of Onset: 03/09/13 HPI: 67 yo male adm to [facility] with infected port-a-cath site.  Pt has prostate cancer with mets to bone.  He is undergoing tx.  Per chart review, pt has h/o dysphagia- pt denies this but daughter admits to pt becoming "choked" on food starting approximately 5 months ago.   Pt required heimlich manuever from shrimp aspiration recently -  Daughter reports that the shrimp the pt expectorated was whole *had not been masticated. She denies issues with pt swallowing liquids.  Pt today underwent portacath removal and is now on a clear liquid diet.  Pt was found in room eating partially reclined due to pain from decub.   Type of Study: Bedside swallow evaluation Previous Swallow Assessment: none Diet Prior to this Study: Thin liquids Temperature Spikes Noted: No Respiratory Status: Supplemental O2 delivered via (comment) History of Recent Intubation: No Behavior/Cognition: Alert;Agitated Oral Cavity - Dentition: Adequate natural dentition;Missing dentition (missing some lower teeth) Self-Feeding Abilities: Able to feed self Patient Positioning: Partially reclined Baseline Vocal Quality: Clear Volitional Cough: Cognitively unable to elicit (did not ask pt to conduct secondary to pan) Volitional Swallow: Able to elicit    Oral/Motor/Sensory Function Overall Oral Motor/Sensory Function: Appears within functional limits for tasks assessed (no focal cn defict from items pt would follow, limited eval)   Ice Chips Ice chips: Not tested   Thin Liquid Thin Liquid: Impaired Pharyngeal  Phase Impairments: Suspected delayed Swallow    Nectar Thick Nectar Thick Liquid: Not tested   Honey Thick Honey Thick Liquid: Not tested   Puree Puree: Not tested Other Comments: pt declined to consume jello and he is on clear liquid diet   Solid   GO    Solid: Not tested Other Comments: pt on clear liquid diet       Mills Koller, MS [facility] SLP [phone]

## 2013-03-09 NOTE — Care Management Note (Addendum)
   CARE MANAGEMENT NOTE 03/09/2013  Patient:  [facility], [facility]   Account Number:  [id number]  Date Initiated:  03/09/2013  Documentation initiated by:  Amaar Oshita  Subjective/Objective Assessment:   67 yo male admitted with infected port site.     Action/Plan:   Home when stable   Anticipated DC Date:     Anticipated DC Plan:  HOME W HOME HEALTH SERVICES      DC Planning Services  CM consult      Choice offered to / List presented to:  NA   DME arranged  NA      DME agency  NA     HH arranged  HH-1 RN  HH-2 PT  HH-3 OT  HH-4 NURSE'S AIDE      HH agency  [facility]   Status of service:  In process, will continue to follow Medicare Important Message given?   (If response is "NO", the following Medicare IM given date fields will be blank) Date Medicare IM given:   Date Additional Medicare IM given:    Discharge Disposition:    Per UR Regulation:  Reviewed for med. necessity/level of care/duration of stay  If discussed at Long Length of Stay Meetings, dates discussed:    Comments:  03/09/13 1400 Makynlie Rossini,RN,MSN [phone] chart reviewed for utilization of services. Pt currently active with [facility] for RN/PT/OT/HHA/SW. Will need resumption of care order prior to discharge.

## 2013-03-09 NOTE — Progress Notes (Signed)
  Subjective: Complains of pain at port site  Objective: Vital signs in last 24 hours: Temp:  [97.8 F (36.6 C)-99 F (37.2 C)] 98.7 F (37.1 C) (09/29 0604) Pulse Rate:  [53-62] 53 (09/29 0604) Resp:  [16-18] 16 (09/29 0604) BP: (139-159)/(57-107) 159/80 mmHg (09/29 0604) SpO2:  [92 %-100 %] 98 % (09/29 0604) Weight:  [207 lb 8 oz (94.121 kg)] 207 lb 8 oz (94.121 kg) (09/28 1505) Last BM Date: 03/07/13  Intake/Output from previous day: 09/28 0701 - 09/29 0700 In: 987 [P.O.:240; I.V.:297; IV Piggyback:450] Out: 175 [Urine:175] Intake/Output this shift:    Chest wall: port site is open and draining on right chest wall  Lab Results:   Recent Labs  03/08/13 1150 03/09/13 0405  WBC 7.8 6.6  HGB 12.5* 11.5*  HCT 38.8* 35.7*  PLT 195 137*   BMET  Recent Labs  03/08/13 1230 03/09/13 0405  NA 135 136  K 4.0 3.9  CL 99 102  CO2 25 25  GLUCOSE 122* 149*  BUN 9 15  CREATININE 0.91 1.08  CALCIUM 8.7 8.3*   PT/INR No results found for this basename: LABPROT, INR,  in the last 72 hours ABG No results found for this basename: PHART, PCO2, PO2, HCO3,  in the last 72 hours  Studies/Results: No results found.  Anti-infectives: Anti-infectives   Start     Dose/Rate Route Frequency Ordered Stop   03/08/13 2359  vancomycin (VANCOCIN) IVPB 1000 mg/200 mL premix     1,000 mg 200 mL/hr over 60 Minutes Intravenous Every 8 hours 03/08/13 1509     03/08/13 1345  vancomycin (VANCOCIN) 1,250 mg in sodium chloride 0.9 % 250 mL IVPB     1,250 mg 166.7 mL/hr over 90 Minutes Intravenous STAT 03/08/13 1324 03/08/13 1544      Assessment/Plan: s/p * No surgery found * Plan to remove port in OR today. Risks and benefits of surgery discussed with pt and he understands and wishes to proceed  LOS: 1 day    TOTH III,Jarred Purtee S 03/09/2013

## 2013-03-09 NOTE — Telephone Encounter (Signed)
Pt's daughter, Nicolette called & reports that pt is in the hosp & had port taken out & she states that they want to d/c him today.  She doesn't agree with this & thinks he needs to stay at least another night.  Discussed with Dr Cyndie Chime & he agrees with Nicolette but doesn't really have any control over this.  She needs to talk with hospitalist, Dr. Jomarie Longs.  Dr Cyndie Chime will try to reach her to discuss leaving in the hospital until culture completed.

## 2013-03-09 NOTE — Transfer of Care (Signed)
Immediate Anesthesia Transfer of Care Note  Patient: [facility]  Procedure(s) Performed: Procedure(s) (LRB): REMOVAL PORT-A-CATH (Right)  Patient Location: PACU  Anesthesia Type: General  Level of Consciousness: sedated, patient cooperative and responds to stimulation  Airway & Oxygen Therapy: Patient Spontanous Breathing and Patient connected to face mask oxgen  Post-op Assessment: Report given to PACU RN and Post -op Vital signs reviewed and stable  Post vital signs: Reviewed and stable  Complications: No apparent anesthesia complications

## 2013-03-09 NOTE — Progress Notes (Signed)
[facility] PROGRESS NOTE  Jack Barrett [name] DOB: 08-24-1945 DOA: 03/08/2013 PCP: No PCP Per Patient  Assessment/Plan: Portacath Local Infection -Was removed today. -Continue vancomycin pending cx data. -Wound care as per surgery. -PRN pain meds.  Prostate Cancer -F/u with Dr. Cyndie Barrett as an outpatient.  Protein malnutrition -Ensure BID with meals.  Code Status: Full Code Family Communication: Wife and brother at bedside  Disposition Plan: Home when stable, anticipate 24-48 hours.   Consultants:  Oncology  Surgery   Antibiotics:  Vancomycin   Subjective: Some mild local pain following portacath removal.  Objective: Filed Vitals:   03/09/13 1045 03/09/13 1100 03/09/13 1115 03/09/13 1130  BP: 172/77 166/71 163/71 158/70  Pulse: 66 65 61 58  Temp:  98.8 F (37.1 C)  98.4 F (36.9 C)  TempSrc:      Resp: 15 14 14 14   Height:      Weight:      SpO2: 100% 94% 99% 98%    Intake/Output Summary (Last 24 hours) at 03/09/13 1728 Last data filed at 03/09/13 1100  Gross per 24 hour  Intake   1197 ml  Output    475 ml  Net    722 ml   Filed Weights   03/08/13 1505  Weight: 94.121 kg (207 lb 8 oz)    Exam:   General:  AA Ox3  Cardiovascular: RRR  Respiratory: CTA B  Abdomen: S/NT/ND/+BS  Extremities: no C/C/E   Neurologic:  Non-focal  Data Reviewed: Basic Metabolic Panel:  Recent Labs Lab 03/08/13 1230 03/09/13 0405  NA 135 136  K 4.0 3.9  CL 99 102  CO2 25 25  GLUCOSE 122* 149*  BUN 9 15  CREATININE 0.91 1.08  CALCIUM 8.7 8.3*   Liver Function Tests:  Recent Labs Lab 03/09/13 0405  AST 7  ALT <5  ALKPHOS 102  BILITOT 0.2*  PROT 5.6*  ALBUMIN 2.3*   No results found for this basename: LIPASE, AMYLASE,  in the last 168 hours No results found for this basename: AMMONIA,  in the last 168 hours CBC:  Recent Labs Lab 03/08/13 1150 03/09/13 0405  WBC 7.8 6.6  NEUTROABS 6.1  --   HGB 12.5* 11.5*  HCT  38.8* 35.7*  MCV 87.6 87.3  PLT 195 137*   Cardiac Enzymes: No results found for this basename: CKTOTAL, CKMB, CKMBINDEX, TROPONINI,  in the last 168 hours BNP (last 3 results) No results found for this basename: PROBNP,  in the last 8760 hours CBG:  Recent Labs Lab 03/08/13 2304 03/09/13 0740 03/09/13 1044 03/09/13 1202 03/09/13 1702  GLUCAP 145* 134* 83 84 114*    Recent Results (from the past 240 hour(s))  CULTURE, BLOOD (ROUTINE X 2)     Status: None   Collection Time    03/08/13 12:24 PM      Result Value Range Status   Specimen Description BLOOD LEFT HAND  7 ML IN [facility] BOTTLE   Final   Special Requests NONE   Final   Culture  Setup Time     Final   Value: 03/08/2013 19:37     Performed at [facility]   Culture     Final   Value:        BLOOD CULTURE RECEIVED NO GROWTH TO DATE CULTURE WILL BE HELD FOR 5 DAYS BEFORE ISSUING A FINAL NEGATIVE REPORT     Performed at [facility]   Report Status PENDING   Incomplete  SURGICAL  PCR SCREEN     Status: None   Collection Time    03/09/13  8:22 AM      Result Value Range Status   MRSA, PCR NEGATIVE  NEGATIVE Final   Staphylococcus aureus NEGATIVE  NEGATIVE Final   Comment:            The Xpert SA Assay (FDA     approved for NASAL specimens     in patients over 47 years of age),     is one component of     a comprehensive surveillance     program.  Test performance has     been validated by [employer] for patients greater     than or equal to 39 year old.     It is not intended     to diagnose infection nor to     guide or monitor treatment.     Studies: No results found.  Scheduled Meds: . abiraterone Acetate  1,000 mg Oral Q24H  . amLODipine  5 mg Oral Daily  . enoxaparin (LOVENOX) injection  40 mg Subcutaneous Q24H  . insulin aspart  0-9 Units Subcutaneous TID WC  . morphine  60 mg Oral TID PC  . predniSONE  5 mg Oral BID  . vancomycin  1,000 mg Intravenous Q8H   Continuous  Infusions:   Principal Problem:   Infection due to port-a-cath Active Problems:   Prostate cancer, primary, with metastasis from prostate to other site   HTN (hypertension), benign   COPD (chronic obstructive pulmonary disease)   IDDM (insulin dependent diabetes mellitus)   Cancer associated pain   Infection due to portacath   Adult failure to thrive    Time spent: 35 minutes    Jack Barrett  [facility] Pager [phone]  If 7PM-7AM, please contact night-coverage at [email or web address], password [facility] 03/09/2013, 5:28 PM  LOS: 1 day

## 2013-03-09 NOTE — Anesthesia Preprocedure Evaluation (Addendum)
Anesthesia Evaluation  Patient identified by MRN, date of birth, ID band Patient awake  General Assessment Comment: Prostate cancer, primary, with metastasis from prostate to other site  01/05/2013   .  HTN (hypertension), benign  01/15/2013   .  COPD (chronic obstructive pulmonary disease)  01/15/2013   .  IDDM (insulin dependent diabetes mellitus)  01/15/2013   .  Peripheral autonomic neuropathy due to DM  01/15/2013   .  Cancer associated pain  01/15/2013   .  Status post right hip replacement  01/15/2013   .  Chronic ulcer of gastric fundus  01/15/2013       EGD 11/24/12   .  Depression, reactive  01/15/2013   .  Decubitus ulcer       Reviewed: Allergy & Precautions, H&P , NPO status , Patient's Chart, lab work & pertinent test results  Airway Mallampati: II TM Distance: >3 FB Neck ROM: Full    Dental  (+) Missing and Poor Dentition Missing many teeth. Denies loose teeth. Many chipped teeth.:   Pulmonary COPDCurrent Smoker,  Productive cough. Some SOB. Room air saturation is 99% breath sounds clear to auscultation  Pulmonary exam normal       Cardiovascular hypertension, Pt. on medications negative cardio ROS  Rhythm:Regular Rate:Normal     Neuro/Psych PSYCHIATRIC DISORDERS Depression  Neuromuscular disease negative psych ROS   GI/Hepatic Neg liver ROS, PUD,   Endo/Other  diabetes, Type 1, Insulin Dependent  Renal/GU negative Renal ROS  negative genitourinary   Musculoskeletal negative musculoskeletal ROS (+)   Abdominal   Peds negative pediatric ROS (+)  Hematology negative hematology ROS (+)   Anesthesia Other Findings   Reproductive/Obstetrics negative OB ROS                        Anesthesia Physical Anesthesia Plan  ASA: III  Anesthesia Plan: General   Post-op Pain Management:    Induction: Intravenous  Airway Management Planned: LMA  Additional Equipment:   Intra-op Plan:    Post-operative Plan: Extubation in OR  Informed Consent: I have reviewed the patients History and Physical, chart, labs and discussed the procedure including the risks, benefits and alternatives for the proposed anesthesia with the patient or authorized representative who has indicated his/her understanding and acceptance.   Dental advisory given  Plan Discussed with: CRNA  Anesthesia Plan Comments: (Scheduled general. Dr. Carolynne Edouard requests general with LMA.)      Anesthesia Quick Evaluation

## 2013-03-09 NOTE — OR Nursing (Signed)
Pt had original portacath placement in [place]. Here today for removal of infected portacath

## 2013-03-09 NOTE — Preoperative (Signed)
Beta Blockers   Reason not to administer Beta Blockers:Not Applicable 

## 2013-03-09 NOTE — Op Note (Signed)
03/08/2013 - 03/09/2013  10:19 AM  PATIENT:  Jack Barrett  67 y.o. male  PRE-OPERATIVE DIAGNOSIS:  infected port a cath  POST-OPERATIVE DIAGNOSIS:  infected portacath  PROCEDURE:  Procedure(s): REMOVAL PORT-A-CATH (Right)  SURGEON:  Surgeon(s) and Role:    * Robyne Askew, MD - Primary  PHYSICIAN ASSISTANT:   ASSISTANTS: none   ANESTHESIA:   general  EBL:     BLOOD ADMINISTERED:none  DRAINS: none   LOCAL MEDICATIONS USED:  MARCAINE     SPECIMEN:  Source of Specimen:  port  DISPOSITION OF SPECIMEN:  PATHOLOGY  COUNTS:  YES  TOURNIQUET:  * No tourniquets in log *  DICTATION: .Dragon Dictation After informed consent was obtained the patient was brought to the operating room placed in the supine position on the operating room table. After adequate induction of general anesthesia the patient's right chest wall was prepped with Betadine and draped in usual sterile manner. The previous incision was opened sharply with a 15 blade knife. The port was identified. Cultures were taken. There were 2 Prolene stitches anchoring the port in place. These were divided and removed. The port was then gently pushed out of its pocket and with gentle traction was removed without difficulty. Pressure was held on the operative bed for several minutes until it was completely hemostatic. The wound was then packed with gauze. Sterile dressings were applied. The patient tolerated the procedure well. At the end of the case all needle sponge and instrument counts were correct. The patient was then awakened and taken to recovery in stable condition.  PLAN OF CARE: Admit to inpatient   PATIENT DISPOSITION:  PACU - hemodynamically stable.   Delay start of Pharmacological VTE agent (>24hrs) due to surgical blood loss or risk of bleeding: no

## 2013-03-10 ENCOUNTER — Encounter (HOSPITAL_COMMUNITY): Payer: Self-pay | Admitting: General Surgery

## 2013-03-10 DIAGNOSIS — Z5189 Encounter for other specified aftercare: Secondary | ICD-10-CM

## 2013-03-10 LAB — GLUCOSE, CAPILLARY
Glucose-Capillary: 121 mg/dL — ABNORMAL HIGH (ref 70–99)
Glucose-Capillary: 140 mg/dL — ABNORMAL HIGH (ref 70–99)
Glucose-Capillary: 215 mg/dL — ABNORMAL HIGH (ref 70–99)

## 2013-03-10 LAB — CBC
MCH: 28.3 pg (ref 26.0–34.0)
MCHC: 32.6 g/dL (ref 30.0–36.0)
Platelets: 140 10*3/uL — ABNORMAL LOW (ref 150–400)
RBC: 4.03 MIL/uL — ABNORMAL LOW (ref 4.22–5.81)

## 2013-03-10 LAB — BASIC METABOLIC PANEL
Calcium: 8.1 mg/dL — ABNORMAL LOW (ref 8.4–10.5)
Chloride: 101 mEq/L (ref 96–112)
GFR calc non Af Amer: 88 mL/min — ABNORMAL LOW (ref >90)
Glucose, Bld: 190 mg/dL — ABNORMAL HIGH (ref 70–99)
Potassium: 3.9 mEq/L (ref 3.5–5.1)
Sodium: 135 mEq/L (ref 135–145)

## 2013-03-10 MED ORDER — ENSURE COMPLETE PO LIQD
237.0000 mL | Freq: Two times a day (BID) | ORAL | Status: DC
Start: 2013-03-10 — End: 2013-03-10

## 2013-03-10 MED ORDER — GLUCERNA SHAKE PO LIQD
237.0000 mL | Freq: Three times a day (TID) | ORAL | Status: DC
  Administered 2013-03-10 – 2013-03-12 (×7): 237 mL via ORAL
  Filled 2013-03-10 (×8): qty 237

## 2013-03-10 MED ORDER — VANCOMYCIN HCL IN DEXTROSE 1-5 GM/200ML-% IV SOLN
1000.0000 mg | Freq: Two times a day (BID) | INTRAVENOUS | Status: DC
  Administered 2013-03-11 – 2013-03-12 (×3): 1000 mg via INTRAVENOUS
  Filled 2013-03-10 (×4): qty 200

## 2013-03-10 NOTE — Progress Notes (Signed)
INITIAL NUTRITION ASSESSMENT  DOCUMENTATION CODES Per approved criteria  -Not Applicable   INTERVENTION: - Glucerna shakes TID - Assisted pt with ordering meal - Will continue to monitor   NUTRITION DIAGNOSIS: Increased nutrient needs related to metastatic prostate CA as evidenced by MD notes.   Goal: Pt to consume 100% of meals and supplements  Monitor:  Weights, labs, intake  Reason for Assessment: Nutrition risk   67 y.o. male  Admitting Dx: Infection due to port-a-cath  ASSESSMENT: Pt with history of metastatic prostate CA, DM, COPD, dysphagia, and failure to thrive. Reported on admission that he had nausea, vomiting, diarrhea for "a long time" from chemotherapy with poor intake for 2 days since port-a-cath infection. Pt alone in room and noted to be poor historian. Per daughter's notes in H&P, pt has had gradual decline in the past several months. Had removal of port-a-cath yesterday.   Pt had SLP bedside swallow evaluation yesterday as daughter reported pt becoming "choked" on food starting around 5 months ago. She reported pt eats small amounts of food throughout the day but continues to have weight loss. Dysphagia 3, thin liquid diet was recommended.   Met with pt who reports eating 3-4 small meals at home however reports 50 pound unintended weight loss in the past 6 months. States he likes Glucerna shakes. Pt sitting up in chair but noted pt with mild/moderate wasting in temporal/orbital region. Weight trend shows pt's weight up in the past month.   Height: Ht Readings from Last 1 Encounters:  03/08/13 6\' 5"  (1.956 m)    Weight: Wt Readings from Last 1 Encounters:  03/08/13 207 lb 8 oz (94.121 kg)    Ideal Body Weight: 208 lb   % Ideal Body Weight: 99%  Wt Readings from Last 10 Encounters:  03/08/13 207 lb 8 oz (94.121 kg)  03/08/13 207 lb 8 oz (94.121 kg)  03/06/13 184 lb (83.462 kg)  02/04/13 183 lb 3.2 oz (83.099 kg)  01/14/13 175 lb 1.6 oz (79.425 kg)     Usual Body Weight: 257 lb per pt   % Usual Body Weight: 81%  BMI:  Body mass index is 24.6 kg/(m^2).  Estimated Nutritional Needs: Kcal: 2350-2750 Protein: 140-160g Fluid: 2.3-2.7L/day  Skin: Neck incision, mid coccyx diabetic ulcer  Diet Order: Cardiac  EDUCATION NEEDS: -No education needs identified at this time   Intake/Output Summary (Last 24 hours) at 03/10/13 0945 Last data filed at 03/10/13 0600  Gross per 24 hour  Intake   1840 ml  Output   1600 ml  Net    240 ml    Last BM: 9/27  Labs:   Recent Labs Lab 03/08/13 1230 03/09/13 0405 03/10/13 0340  NA 135 136 135  K 4.0 3.9 3.9  CL 99 102 101  CO2 25 25 25   BUN 9 15 10   CREATININE 0.91 1.08 0.91  CALCIUM 8.7 8.3* 8.1*  GLUCOSE 122* 149* 190*    CBG (last 3)   Recent Labs  03/09/13 1702 03/09/13 2226 03/10/13 0730  GLUCAP 114* 121* 133*    Scheduled Meds: . abiraterone Acetate  1,000 mg Oral Q24H  . amLODipine  5 mg Oral Daily  . enoxaparin (LOVENOX) injection  40 mg Subcutaneous Q24H  . feeding supplement  237 mL Oral BID BM  . insulin aspart  0-9 Units Subcutaneous TID WC  . morphine  60 mg Oral TID PC  . predniSONE  5 mg Oral BID  . vancomycin  1,000 mg Intravenous  Q8H    Continuous Infusions:   Past Medical History  Diagnosis Date  . Prostate cancer, primary, with metastasis from prostate to other site 01/05/2013  . HTN (hypertension), benign 01/15/2013  . COPD (chronic obstructive pulmonary disease) 01/15/2013  . IDDM (insulin dependent diabetes mellitus) 01/15/2013  . 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 Cancer associated pain 01/15/2013  . Status post right hip replacement 01/15/2013  . Chronic ulcer of gastric fundus 01/15/2013    EGD 11/24/12  . Depression, reactive 01/15/2013  . Decubitus ulcer     Past Surgical History  Procedure Laterality Date  . Total hip arthroplasty    . Prostate surgery    . Porta cath    . Skin grafts      Levon Hedger MS, RD,  LDN [phone] Pager [phone] After Hours Pager

## 2013-03-10 NOTE — Progress Notes (Signed)
ANTIBIOTIC CONSULT NOTE - FOLLOW UP  Pharmacy Consult for Vancomycin Indication: PAC infection  Allergies  Allergen Reactions  . Aspirin Other (See Comments)    hemmoriage  . Codeine Other (See Comments)    hallucinations  . Lactose Intolerance (Gi) Diarrhea    Patient Measurements: Height: 6\' 5"  (195.6 cm) Weight: 207 lb 8 oz (94.121 kg) IBW/kg (Calculated) : 89.1  Vital Signs: Temp: 100.1 F (37.8 C) (09/30 1325) Temp src: Oral (09/30 1325) BP: 148/59 mmHg (09/30 1325) Pulse Rate: 53 (09/30 1325) Intake/Output from previous day: 09/29 0701 - 09/30 0700 In: 1840 [P.O.:240; I.V.:700; IV Piggyback:600] Out: 1600 [Urine:1600] Intake/Output from this shift: Total I/O In: 620 [P.O.:420; IV Piggyback:200] Out: 400 [Urine:400]  Labs:  Recent Labs  03/08/13 1150 03/08/13 1230 03/09/13 0405 03/10/13 0340  WBC 7.8  --  6.6 5.0  HGB 12.5*  --  11.5* 11.4*  PLT 195  --  137* 140*  CREATININE  --  0.91 1.08 0.91   Estimated Creatinine Clearance: 103.4 ml/min (by C-G formula based on Cr of 0.91).  Recent Labs  03/10/13 1530  VANCOTROUGH 28.6*     Microbiology: Recent Results (from the past 720 hour(s))  CULTURE, BLOOD (ROUTINE X 2)     Status: None   Collection Time    03/08/13 12:24 PM      Result Value Range Status   Specimen Description BLOOD LEFT HAND  7 ML IN [facility] BOTTLE   Final   Special Requests NONE   Final   Culture  Setup Time     Final   Value: 03/08/2013 19:37     Performed at [facility]   Culture     Final   Value:        BLOOD CULTURE RECEIVED NO GROWTH TO DATE CULTURE WILL BE HELD FOR 5 DAYS BEFORE ISSUING A FINAL NEGATIVE REPORT     Performed at [facility]   Report Status PENDING   Incomplete  CULTURE, BLOOD (ROUTINE X 2)     Status: None   Collection Time    03/08/13  6:15 PM      Result Value Range Status   Specimen Description BLOOD LEFT ARM   Final   Special Requests BOTTLES DRAWN AEROBIC ONLY 5CC   Final   Culture   Setup Time     Final   Value: 03/09/2013 11:48     Performed at [facility]   Culture     Final   Value:        BLOOD CULTURE RECEIVED NO GROWTH TO DATE CULTURE WILL BE HELD FOR 5 DAYS BEFORE ISSUING A FINAL NEGATIVE REPORT     Performed at [facility]   Report Status PENDING   Incomplete  SURGICAL PCR SCREEN     Status: None   Collection Time    03/09/13  8:22 AM      Result Value Range Status   MRSA, PCR NEGATIVE  NEGATIVE Final   Staphylococcus aureus NEGATIVE  NEGATIVE Final   Comment:            The Xpert SA Assay (FDA     approved for NASAL specimens     in patients over 32 years of age),     is one component of     a comprehensive surveillance     program.  Test performance has     been validated by [employer] for patients greater  than or equal to 59 year old.     It is not intended     to diagnose infection nor to     guide or monitor treatment.  CATH TIP CULTURE     Status: None   Collection Time    03/09/13 10:12 AM      Result Value Range Status   Specimen Description CATH TIP PORTACATH TIP   Final   Special Requests PATIENT ON FOLLOWING [facility]   Final   Culture     Final   Value: Culture reincubated for better growth     Performed at [facility]   Report Status PENDING   Incomplete  WOUND CULTURE     Status: None   Collection Time    03/09/13 10:25 AM      Result Value Range Status   Specimen Description DRAINAGE PORTACATH SITE RT NECK   Final   Special Requests PATIENT ON FOLLOWING [facility]   Final   Gram Stain     Final   Value: MODERATE WBC PRESENT,BOTH PMN AND MONONUCLEAR     RARE SQUAMOUS EPITHELIAL CELLS PRESENT     NO ORGANISMS SEEN     Performed at [facility]     Final   Value: Culture reincubated for better growth     Performed at [facility]   Report Status PENDING   Incomplete  ANAEROBIC CULTURE     Status: None   Collection Time    03/09/13 10:25 AM      Result Value Range Status    Specimen Description DRAINAGE PORTACATH SITE RT NECK   Final   Special Requests PATIENT ON FOLLOWING [facility]   Final   Gram Stain     Final   Value: MODERATE WBC PRESENT,BOTH PMN AND MONONUCLEAR     NO SQUAMOUS EPITHELIAL CELLS SEEN     NO ORGANISMS SEEN     Performed at [facility]   Culture     Final   Value: NO ANAEROBES ISOLATED; CULTURE IN PROGRESS FOR 5 DAYS     Performed at [facility]   Report Status PENDING   Incomplete    Anti-infectives   Start     Dose/Rate Route Frequency Ordered Stop   03/08/13 2359  vancomycin (VANCOCIN) IVPB 1000 mg/200 mL premix     1,000 mg 200 mL/hr over 60 Minutes Intravenous Every 8 hours 03/08/13 1509     03/08/13 1345  vancomycin (VANCOCIN) 1,250 mg in sodium chloride 0.9 % 250 mL IVPB     1,250 mg 166.7 mL/hr over 90 Minutes Intravenous STAT 03/08/13 1324 03/08/13 1544      Assessment: 67 year old male with known metastatic prostate cancer presents with PAC infection, s/p removal 9/29.  Day #3 Vancomycin 1g IV q8h (after 1250mg  load on 9/28)  Vancomycin trough supratherapeutic at 28.6 mcg/ml  SCr stable, CrCl ~ 100 ml/min  WBC wnl  Tmax 100.6   Goal of Therapy:  Vancomycin trough level 15-20 mcg/ml  Plan:   Decrease vancomycin to 1g IV q12h - next dose in ~18hr to allow drug clearance  Recheck trough at steady state  Loralee Pacas, PharmD, BCPS Pager: [phone] 03/10/2013,4:56 PM

## 2013-03-10 NOTE — Progress Notes (Signed)
[facility] PROGRESS NOTE  NAYIB REMER WGN:[id number] DOB: 09-Mar-1946 DOA: 03/08/2013 PCP: No PCP Per Patient  Assessment/Plan: Portacath Local Infection -Was removed. -Continue vancomycin pending cx data. -Wound care as per surgery. Dressing was changed this am. -PRN pain meds.  Prostate Cancer -F/u with Dr. Cyndie Chime as an outpatient.  Protein malnutrition -Ensure BID with meals.  Code Status: Full Code Family Communication: Wife and brother at bedside  Disposition Plan: Home when stable and cx data returns,  anticipate 24-48 hours.   Consultants:  Oncology  Surgery   Antibiotics:  Vancomycin   Subjective: Some mild local pain following portacath removal. Also c/o right hand pain at the site of his IV.  Objective: Filed Vitals:   03/09/13 1130 03/09/13 2230 03/10/13 0530 03/10/13 0819  BP: 158/70 148/74 134/68   Pulse: 58 57 58   Temp: 98.4 F (36.9 C) 99.3 F (37.4 C) 99.4 F (37.4 C)   TempSrc:  Oral Oral   Resp: 14 16 16    Height:      Weight:      SpO2: 98% 100% 100% 100%    Intake/Output Summary (Last 24 hours) at 03/10/13 1015 Last data filed at 03/10/13 0600  Gross per 24 hour  Intake   1840 ml  Output   1600 ml  Net    240 ml   Filed Weights   03/08/13 1505  Weight: 94.121 kg (207 lb 8 oz)    Exam:   General:  AA Ox3  Cardiovascular: RRR  Respiratory: CTA B  Abdomen: S/NT/ND/+BS  Extremities: no C/C/E   Neurologic:  Non-focal  Data Reviewed: Basic Metabolic Panel:  Recent Labs Lab 03/08/13 1230 03/09/13 0405 03/10/13 0340  NA 135 136 135  K 4.0 3.9 3.9  CL 99 102 101  CO2 25 25 25   GLUCOSE 122* 149* 190*  BUN 9 15 10   CREATININE 0.91 1.08 0.91  CALCIUM 8.7 8.3* 8.1*   Liver Function Tests:  Recent Labs Lab 03/09/13 0405  AST 7  ALT <5  ALKPHOS 102  BILITOT 0.2*  PROT 5.6*  ALBUMIN 2.3*   No results found for this basename: LIPASE, AMYLASE,  in the last 168 hours No results found for  this basename: AMMONIA,  in the last 168 hours CBC:  Recent Labs Lab 03/08/13 1150 03/09/13 0405 03/10/13 0340  WBC 7.8 6.6 5.0  NEUTROABS 6.1  --   --   HGB 12.5* 11.5* 11.4*  HCT 38.8* 35.7* 35.0*  MCV 87.6 87.3 86.8  PLT 195 137* 140*   Cardiac Enzymes: No results found for this basename: CKTOTAL, CKMB, CKMBINDEX, TROPONINI,  in the last 168 hours BNP (last 3 results) No results found for this basename: PROBNP,  in the last 8760 hours CBG:  Recent Labs Lab 03/09/13 1044 03/09/13 1202 03/09/13 1702 03/09/13 2226 03/10/13 0730  GLUCAP 83 84 114* 121* 133*    Recent Results (from the past 240 hour(s))  CULTURE, BLOOD (ROUTINE X 2)     Status: None   Collection Time    03/08/13 12:24 PM      Result Value Range Status   Specimen Description BLOOD LEFT HAND  7 ML IN [facility] BOTTLE   Final   Special Requests NONE   Final   Culture  Setup Time     Final   Value: 03/08/2013 19:37     Performed at [facility]   Culture     Final   Value:  BLOOD CULTURE RECEIVED NO GROWTH TO DATE CULTURE WILL BE HELD FOR 5 DAYS BEFORE ISSUING A FINAL NEGATIVE REPORT     Performed at [facility]   Report Status PENDING   Incomplete  CULTURE, BLOOD (ROUTINE X 2)     Status: None   Collection Time    03/08/13  6:15 PM      Result Value Range Status   Specimen Description BLOOD LEFT ARM   Final   Special Requests BOTTLES DRAWN AEROBIC ONLY 5CC   Final   Culture  Setup Time     Final   Value: 03/09/2013 11:48     Performed at [facility]   Culture     Final   Value:        BLOOD CULTURE RECEIVED NO GROWTH TO DATE CULTURE WILL BE HELD FOR 5 DAYS BEFORE ISSUING A FINAL NEGATIVE REPORT     Performed at [facility]   Report Status PENDING   Incomplete  SURGICAL PCR SCREEN     Status: None   Collection Time    03/09/13  8:22 AM      Result Value Range Status   MRSA, PCR NEGATIVE  NEGATIVE Final   Staphylococcus aureus NEGATIVE  NEGATIVE Final    Comment:            The Xpert SA Assay (FDA     approved for NASAL specimens     in patients over 65 years of age),     is one component of     a comprehensive surveillance     program.  Test performance has     been validated by [employer] for patients greater     than or equal to 74 year old.     It is not intended     to diagnose infection nor to     guide or monitor treatment.  CATH TIP CULTURE     Status: None   Collection Time    03/09/13 10:12 AM      Result Value Range Status   Specimen Description CATH TIP PORTACATH TIP   Final   Special Requests PATIENT ON FOLLOWING [facility]   Final   Culture     Final   Value: Culture reincubated for better growth     Performed at [facility]   Report Status PENDING   Incomplete  WOUND CULTURE     Status: None   Collection Time    03/09/13 10:25 AM      Result Value Range Status   Specimen Description DRAINAGE PORTACATH SITE RT NECK   Final   Special Requests PATIENT ON FOLLOWING VANC   Final   Gram Stain     Final   Value: MODERATE WBC PRESENT,BOTH PMN AND MONONUCLEAR     RARE SQUAMOUS EPITHELIAL CELLS PRESENT     NO ORGANISMS SEEN     Performed at [facility]     Final   Value: Culture reincubated for better growth     Performed at [facility]   Report Status PENDING   Incomplete  ANAEROBIC CULTURE     Status: None   Collection Time    03/09/13 10:25 AM      Result Value Range Status   Specimen Description DRAINAGE PORTACATH SITE RT NECK   Final   Special Requests PATIENT ON FOLLOWING VANC   Final   Gram Stain  Final   Value: MODERATE WBC PRESENT,BOTH PMN AND MONONUCLEAR     NO SQUAMOUS EPITHELIAL CELLS SEEN     NO ORGANISMS SEEN     Performed at [facility]   Culture PENDING   Incomplete   Report Status PENDING   Incomplete     Studies: No results found.  Scheduled Meds: . abiraterone Acetate  1,000 mg Oral Q24H  . amLODipine  5 mg Oral Daily  . enoxaparin  (LOVENOX) injection  40 mg Subcutaneous Q24H  . feeding supplement  237 mL Oral TID BM  . insulin aspart  0-9 Units Subcutaneous TID WC  . morphine  60 mg Oral TID PC  . predniSONE  5 mg Oral BID  . vancomycin  1,000 mg Intravenous Q8H   Continuous Infusions:   Principal Problem:   Infection due to port-a-cath Active Problems:   Prostate cancer, primary, with metastasis from prostate to other site   HTN (hypertension), benign   COPD (chronic obstructive pulmonary disease)   IDDM (insulin dependent diabetes mellitus)   Cancer associated pain   Infection due to portacath   Adult failure to thrive    Time spent: 35 minutes    HERNANDEZ ACOSTA,ESTELA  [facility] Pager [phone]  If 7PM-7AM, please contact night-coverage at [email or web address], password [facility] 03/10/2013, 10:15 AM  LOS: 2 days

## 2013-03-10 NOTE — Progress Notes (Addendum)
Porta-cath removed T max 99.4 on IV vancomycin Lethargic but easily arousable. Scant bloody drainage on surgical dressing. One of 2 blood cultures may be positive "reincubated for better growth" Wound cultures still outstanding I would vote to keep him in hospital until cultures mature so we can make appropriate decision re type and duration of antibiotics

## 2013-03-10 NOTE — Progress Notes (Signed)
1 Day Post-Op  Subjective: Dressing changed, complains of right hand being sore, site of IV  Objective: Vital signs in last 24 hours: Temp:  [98.1 F (36.7 C)-99.4 F (37.4 C)] 99.4 F (37.4 C) (09/30 0530) Pulse Rate:  [57-66] 58 (09/30 0530) Resp:  [13-16] 16 (09/30 0530) BP: (134-172)/(68-77) 134/68 mmHg (09/30 0530) SpO2:  [94 %-100 %] 100 % (09/30 0819) Last BM Date: 03/07/13 240 PO recorded,  Cardiac diet Tm 99.4 Labs OK  Intake/Output from previous day: 09/29 0701 - 09/30 0700 In: 1840 [P.O.:240; I.V.:700; IV Piggyback:600] Out: 1600 [Urine:1600] Intake/Output this shift:    General appearance: alert, cooperative and no distress Skin: Skin color, texture, turgor normal. No rashes or lesions or Dressing from port site removed.  it is clean and looks good.  Repacked with open wet 4 x 4.  Lab Results:   Recent Labs  03/09/13 0405 03/10/13 0340  WBC 6.6 5.0  HGB 11.5* 11.4*  HCT 35.7* 35.0*  PLT 137* 140*    BMET  Recent Labs  03/09/13 0405 03/10/13 0340  NA 136 135  K 3.9 3.9  CL 102 101  CO2 25 25  GLUCOSE 149* 190*  BUN 15 10  CREATININE 1.08 0.91  CALCIUM 8.3* 8.1*   PT/INR No results found for this basename: LABPROT, INR,  in the last 72 hours   Recent Labs Lab 03/09/13 0405  AST 7  ALT <5  ALKPHOS 102  BILITOT 0.2*  PROT 5.6*  ALBUMIN 2.3*     Lipase  No results found for this basename: lipase     Studies/Results: No results found.  Medications: . abiraterone Acetate  1,000 mg Oral Q24H  . amLODipine  5 mg Oral Daily  . enoxaparin (LOVENOX) injection  40 mg Subcutaneous Q24H  . feeding supplement  237 mL Oral BID BM  . insulin aspart  0-9 Units Subcutaneous TID WC  . morphine  60 mg Oral TID PC  . predniSONE  5 mg Oral BID  . vancomycin  1,000 mg Intravenous Q8H    Assessment/Plan Infected Portacath Metastatic Prostate CA COPD AODM Hypertension Neuropathy  Gastric fundus ulcer 11/2012   Plan:  Wet to dry  dressings, BID.      LOS: 2 days    Jack Barrett 03/10/2013

## 2013-03-10 NOTE — Progress Notes (Signed)
CRITICAL VALUE ALERT  Critical value received:  vanc trough 28.6  Date of notification:  03/10/2013   Time of notification:  4:58 PM   Critical value read back:yes  Nurse who received alert:  Armanda Heritage   MD notified (1st page):  hernandez   Time of first page:  4:58 PM   MD notified (2nd page):  Time of second page:  Responding MD:  Ardyth Harps  Time MD responded:  5:00 PM

## 2013-03-11 DIAGNOSIS — G893 Neoplasm related pain (acute) (chronic): Secondary | ICD-10-CM

## 2013-03-11 DIAGNOSIS — C7951 Secondary malignant neoplasm of bone: Secondary | ICD-10-CM

## 2013-03-11 LAB — GLUCOSE, CAPILLARY
Glucose-Capillary: 136 mg/dL — ABNORMAL HIGH (ref 70–99)
Glucose-Capillary: 159 mg/dL — ABNORMAL HIGH (ref 70–99)
Glucose-Capillary: 154 mg/dL — ABNORMAL HIGH (ref 70–99)
Glucose-Capillary: 134 mg/dL — ABNORMAL HIGH (ref 70–99)

## 2013-03-11 NOTE — Progress Notes (Signed)
Speech Language Pathology Dysphagia Treatment Patient Details Name: Jack Barrett MRN: [id number] DOB: 13-Mar-1946 Today's Date: 03/11/2013 Time: 1191-4782 SLP Time Calculation (min): 27 min  Assessment / Plan / Recommendation Clinical Impression  Pt. appears to be tolerating po's without s/s of aspiration.  Pt. denies any difficulty, and RN reports pt. has eatten 100% of meals without any coughing/choking.  Pt. swallowed large consecutive sips of water via straw and ate fruit cup and crackers without difficulty.  Will d/c ST.    Diet Recommendation  Continue with Current Diet: Dysphagia 3 (mechanical soft);Thin liquid    SLP Plan Discharge SLP treatment due to (comment);All goals met   Pertinent Vitals/Pain n/a   Swallowing Goals     General Temperature Spikes Noted: Yes (Low grade) Respiratory Status: Supplemental O2 delivered via (comment) Behavior/Cognition: Alert;Cooperative;Pleasant mood Oral Cavity - Dentition: Adequate natural dentition;Missing dentition Patient Positioning: Upright in bed  Oral Cavity - Oral Hygiene Does patient have any of the following "at risk" factors?: None of the above Brush patient's teeth BID with toothbrush (using toothpaste with fluoride): Yes   Dysphagia Treatment Treatment focused on: Skilled observation of diet tolerance;Patient/family/caregiver education Family/Caregiver Educated: Pt. Treatment Methods/Modalities: Skilled observation Patient observed directly with PO's: Yes Type of PO's observed: Regular;Thin liquids Feeding: Able to feed self Liquids provided via: Straw Type of cueing: Verbal Amount of cueing: Minimal   GO     Maryjo Rochester T 03/11/2013, 4:33 PM

## 2013-03-11 NOTE — Progress Notes (Signed)
Low-grade temperatures yesterday 100.2  maximum, day #3 IV vancomycin Clinically doing better. Much more alert. Wound dressing dry. Exam otherwise unchanged. Both blood cultures now being reported as negative. Wound culture still outstanding. Impression: #1. Infected Port-A-Cath infusion device with the assistance of skin overlying the device. No evidence for systemic infection at this time. Consider transition to oral antistaphylococcal antibiotics once results of wound cultures reported out. #2. Prostate cancer metastatic to bone Recently started trial Zytiga/low dose prednisone  #3. 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 #4. Insulin-dependent diabetes

## 2013-03-11 NOTE — Progress Notes (Signed)
2 Days Post-Op  Subjective: He is feeling much better. Denies any pain  Objective: Vital signs in last 24 hours: Temp:  [98.5 F (36.9 C)-100.2 F (37.9 C)] 98.5 F (36.9 C) (10/01 0500) Pulse Rate:  [52-56] 53 (10/01 0500) Resp:  [16-18] 18 (10/01 0500) BP: (146-156)/(59-100) 146/69 mmHg (10/01 0500) SpO2:  [96 %-100 %] 98 % (10/01 0500) Last BM Date: 03/07/13  Intake/Output from previous day: 09/30 0701 - 10/01 0700 In: 1300 [P.O.:900; IV Piggyback:400] Out: 2250 [Urine:2250] Intake/Output this shift:    Chest wall: no tenderness, wound clean  Lab Results:   Recent Labs  03/09/13 0405 03/10/13 0340  WBC 6.6 5.0  HGB 11.5* 11.4*  HCT 35.7* 35.0*  PLT 137* 140*   BMET  Recent Labs  03/09/13 0405 03/10/13 0340  NA 136 135  K 3.9 3.9  CL 102 101  CO2 25 25  GLUCOSE 149* 190*  BUN 15 10  CREATININE 1.08 0.91  CALCIUM 8.3* 8.1*   PT/INR No results found for this basename: LABPROT, INR,  in the last 72 hours ABG No results found for this basename: PHART, PCO2, PO2, HCO3,  in the last 72 hours  Studies/Results: No results found.  Anti-infectives: Anti-infectives   Start     Dose/Rate Route Frequency Ordered Stop   03/11/13 1000  vancomycin (VANCOCIN) IVPB 1000 mg/200 mL premix     1,000 mg 200 mL/hr over 60 Minutes Intravenous Every 12 hours 03/10/13 1702     03/08/13 2359  vancomycin (VANCOCIN) IVPB 1000 mg/200 mL premix  Status:  Discontinued     1,000 mg 200 mL/hr over 60 Minutes Intravenous Every 8 hours 03/08/13 1509 03/10/13 1702   03/08/13 1345  vancomycin (VANCOCIN) 1,250 mg in sodium chloride 0.9 % 250 mL IVPB     1,250 mg 166.7 mL/hr over 90 Minutes Intravenous STAT 03/08/13 1324 03/08/13 1544      Assessment/Plan: s/p Procedure(s): REMOVAL PORT-A-CATH (Right) Continue abx Continue daily shower and dressing change until healed Will sign off  LOS: 3 days    TOTH III,PAUL S 03/11/2013

## 2013-03-11 NOTE — Progress Notes (Signed)
[facility] PROGRESS NOTE  Jack Barrett ZOX:[id number] DOB: [birth year] DOA: 03/08/2013 PCP: No PCP Per Patient  Assessment/Plan: Portacath/Catheter tunnel infection -prelim culture--staph aureus -surgically removed 9/30 -Continue vancomycin pending cx data.  -03/08/2013 blood cultures negative -03/09/2013 tunnel culture--staph aureus -Wound care as per surgery. Dressing was changed this am.  -PRN dilaudid for breakthrough pain -Continue MS Contin -If blood cultures remain negative--was switched to Bactrim DS if MRSA Prostate Cancer  -F/u with Dr. Cyndie Chime as an outpatient.  -continue Zytiga  hypertension -Continue amlodipine Diabetes mellitus type 2 -given clinical scenario--will allow for more liberal glycemic control -Hemoglobin 26.6 Protein malnutrition  -Ensure BID with meals.  Code Status: Full Code  Family Communication: tried to call daughters--no answer    Antibiotics:  Vancomycin 03/08/2013>>>    Procedures/Studies: Dg Chest 2 View  03/06/2013   CLINICAL DATA:  Chest pain. History of hypertension and COPD.  EXAM: CHEST  2 VIEW  COMPARISON:  None.  FINDINGS: Cardiac silhouette is normal in size and configuration. No mediastinal or hilar masses.  The lungs are mildly hyperexpanded.  There are thickened interstitial markings diffusely. This may all be chronic. A superimposed infiltrate is not excluded, however. There are no focal areas of airspace consolidation. No pleural effusion or pneumothorax is seen.  Right anterior chest wall power Port-A-Cath has its tip in the lower superior vena cava.  The bony thorax is demineralized but intact.  IMPRESSION: Diffusely thickened interstitial markings as well as lung hyperexpansion. This may all be chronic. A superimposed acute interstitial infiltrate is not excluded but felt less likely.   Electronically Signed   By: Amie Portland   On: 03/06/2013 21:05         Subjective: Patient complains of pain over  Port-A-Cath site. Denies fevers, chills, chest discomfort, nausea, vomiting, diarrhea, abdominal pain, dysuria.  Objective: Filed Vitals:   03/10/13 1956 03/10/13 2120 03/11/13 0500 03/11/13 1401  BP: 156/100 147/64 146/69 132/63  Pulse: 56 52 53 48  Temp: 98.9 F (37.2 C) 99.9 F (37.7 C) 98.5 F (36.9 C) 98.4 F (36.9 C)  TempSrc: Oral Oral Oral   Resp: 18 16 18 16   Height:      Weight:      SpO2: 96% 100% 98% 96%    Intake/Output Summary (Last 24 hours) at 03/11/13 1452 Last data filed at 03/11/13 1300  Gross per 24 hour  Intake   1600 ml  Output   2500 ml  Net   -900 ml   Weight change:  Exam:   General:  Pt is alert, follows commands appropriately, not in acute distress  HEENT: No icterus, No thrush,[place]/AT  Cardiovascular: RRR, S1/S2, no rubs, no gallops  Respiratory: CTA bilaterally, no wheezing, no crackles, no rhonchi  Abdomen: Soft/+BS, non tender, non distended, no guarding  Extremities: No edema, No lymphangitis, No petechiae, No rashes, no synovitis; right chest wall without any erythema or crepitance--wound is packed  Data Reviewed: Basic Metabolic Panel:  Recent Labs Lab 03/08/13 1230 03/09/13 0405 03/10/13 0340  NA 135 136 135  K 4.0 3.9 3.9  CL 99 102 101  CO2 25 25 25   GLUCOSE 122* 149* 190*  BUN 9 15 10   CREATININE 0.91 1.08 0.91  CALCIUM 8.7 8.3* 8.1*   Liver Function Tests:  Recent Labs Lab 03/09/13 0405  AST 7  ALT <5  ALKPHOS 102  BILITOT 0.2*  PROT 5.6*  ALBUMIN 2.3*   No results found for this basename: LIPASE, AMYLASE,  in  the last 168 hours No results found for this basename: AMMONIA,  in the last 168 hours CBC:  Recent Labs Lab 03/08/13 1150 03/09/13 0405 03/10/13 0340  WBC 7.8 6.6 5.0  NEUTROABS 6.1  --   --   HGB 12.5* 11.5* 11.4*  HCT 38.8* 35.7* 35.0*  MCV 87.6 87.3 86.8  PLT 195 137* 140*   Cardiac Enzymes: No results found for this basename: CKTOTAL, CKMB, CKMBINDEX, TROPONINI,  in the last 168  hours BNP: No components found with this basename: POCBNP,  CBG:  Recent Labs Lab 03/10/13 1147 03/10/13 1700 03/10/13 2141 03/11/13 0726 03/11/13 1140  GLUCAP 140* 200* 215* 136* 159*    Recent Results (from the past 240 hour(s))  CULTURE, BLOOD (ROUTINE X 2)     Status: None   Collection Time    03/08/13 12:24 PM      Result Value Range Status   Specimen Description BLOOD LEFT HAND  7 ML IN [facility] BOTTLE   Final   Special Requests NONE   Final   Culture  Setup Time     Final   Value: 03/08/2013 19:37     Performed at [facility]   Culture     Final   Value:        BLOOD CULTURE RECEIVED NO GROWTH TO DATE CULTURE WILL BE HELD FOR 5 DAYS BEFORE ISSUING A FINAL NEGATIVE REPORT     Performed at [facility]   Report Status PENDING   Incomplete  CULTURE, BLOOD (ROUTINE X 2)     Status: None   Collection Time    03/08/13  6:15 PM      Result Value Range Status   Specimen Description BLOOD LEFT ARM   Final   Special Requests BOTTLES DRAWN AEROBIC ONLY 5CC   Final   Culture  Setup Time     Final   Value: 03/09/2013 11:48     Performed at [facility]   Culture     Final   Value:        BLOOD CULTURE RECEIVED NO GROWTH TO DATE CULTURE WILL BE HELD FOR 5 DAYS BEFORE ISSUING A FINAL NEGATIVE REPORT     Performed at [facility]   Report Status PENDING   Incomplete  SURGICAL PCR SCREEN     Status: None   Collection Time    03/09/13  8:22 AM      Result Value Range Status   MRSA, PCR NEGATIVE  NEGATIVE Final   Staphylococcus aureus NEGATIVE  NEGATIVE Final   Comment:            The Xpert SA Assay (FDA     approved for NASAL specimens     in patients over 45 years of age),     is one component of     a comprehensive surveillance     program.  Test performance has     been validated by [employer] for patients greater     than or equal to 49 year old.     It is not intended     to diagnose infection nor to     guide or monitor  treatment.  CATH TIP CULTURE     Status: None   Collection Time    03/09/13 10:12 AM      Result Value Range Status   Specimen Description CATH TIP PORTACATH TIP   Final   Special Requests PATIENT ON  FOLLOWING [facility]   Final   Culture     Final   Value: Culture reincubated for better growth     Performed at [facility]   Report Status PENDING   Incomplete  WOUND CULTURE     Status: None   Collection Time    03/09/13 10:25 AM      Result Value Range Status   Specimen Description DRAINAGE PORTACATH SITE RT NECK   Final   Special Requests PATIENT ON FOLLOWING VANC   Final   Gram Stain     Final   Value: MODERATE WBC PRESENT,BOTH PMN AND MONONUCLEAR     RARE SQUAMOUS EPITHELIAL CELLS PRESENT     NO ORGANISMS SEEN     Performed at [facility]   Culture     Final   Value: FEW STAPHYLOCOCCUS AUREUS     Note: RIFAMPIN AND GENTAMICIN SHOULD NOT BE USED AS SINGLE DRUGS FOR TREATMENT OF STAPH INFECTIONS.     Performed at [facility]   Report Status PENDING   Incomplete  ANAEROBIC CULTURE     Status: None   Collection Time    03/09/13 10:25 AM      Result Value Range Status   Specimen Description DRAINAGE PORTACATH SITE RT NECK   Final   Special Requests PATIENT ON FOLLOWING VANC   Final   Gram Stain     Final   Value: MODERATE WBC PRESENT,BOTH PMN AND MONONUCLEAR     NO SQUAMOUS EPITHELIAL CELLS SEEN     NO ORGANISMS SEEN     Performed at [facility]   Culture     Final   Value: NO ANAEROBES ISOLATED; CULTURE IN PROGRESS FOR 5 DAYS     Performed at [facility]   Report Status PENDING   Incomplete     Scheduled Meds: . abiraterone Acetate  1,000 mg Oral Q24H  . amLODipine  5 mg Oral Daily  . enoxaparin (LOVENOX) injection  40 mg Subcutaneous Q24H  . feeding supplement  237 mL Oral TID BM  . insulin aspart  0-9 Units Subcutaneous TID WC  . morphine  60 mg Oral TID PC  . predniSONE  5 mg Oral BID  . vancomycin  1,000 mg Intravenous Q12H    Continuous Infusions:    Ignace Mandigo, DO  [facility] Pager [phone]  If 7PM-7AM, please contact night-coverage [email or web address] Password [email or web address] 03/11/2013, 2:52 PM   LOS: 3 days

## 2013-03-12 ENCOUNTER — Telehealth: Payer: Self-pay | Admitting: *Deleted

## 2013-03-12 ENCOUNTER — Other Ambulatory Visit: Payer: Self-pay | Admitting: *Deleted

## 2013-03-12 DIAGNOSIS — T889XXS Complication of surgical and medical care, unspecified, sequela: Secondary | ICD-10-CM

## 2013-03-12 DIAGNOSIS — F341 Dysthymic disorder: Secondary | ICD-10-CM

## 2013-03-12 LAB — CBC
Platelets: 137 10*3/uL — ABNORMAL LOW (ref 150–400)
RBC: 4.41 MIL/uL (ref 4.22–5.81)
WBC: 4.9 10*3/uL (ref 4.0–10.5)

## 2013-03-12 LAB — BASIC METABOLIC PANEL
CO2: 27 mEq/L (ref 19–32)
Chloride: 101 mEq/L (ref 96–112)
Potassium: 4.5 mEq/L (ref 3.5–5.1)
Sodium: 137 mEq/L (ref 135–145)

## 2013-03-12 LAB — WOUND CULTURE

## 2013-03-12 LAB — GLUCOSE, CAPILLARY: Glucose-Capillary: 152 mg/dL — ABNORMAL HIGH (ref 70–99)

## 2013-03-12 MED ORDER — HYDROMORPHONE HCL 8 MG PO TABS: 8.0000 mg | ORAL_TABLET | ORAL | Status: DC | PRN

## 2013-03-12 MED ORDER — SULFAMETHOXAZOLE-TMP DS 800-160 MG PO TABS: 2.0000 | ORAL_TABLET | Freq: Two times a day (BID) | ORAL | Status: DC

## 2013-03-12 MED ORDER — MORPHINE SULFATE ER 60 MG PO TBCR: 60.0000 mg | EXTENDED_RELEASE_TABLET | Freq: Three times a day (TID) | ORAL | Status: DC

## 2013-03-12 NOTE — Evaluation (Signed)
Physical Therapy Evaluation Patient Details Name: Jack Barrett MRN: [id number] DOB: [birth year] Today's Date: 03/12/2013 Time: 0937-1005 PT Time Calculation (min): 28 min  PT Assessment / Plan / Recommendation History of Present Illness  Jack Barrett is a 67 y.o. male with PMH of Metastatic Prostate CA, currently on Zytiga, DM, COPD, dysphagia, failure to thrive was brought to [place] ER 2 days ago per [facility] RN request due to purulent drainage from port site,   Clinical Impression  Pt responded well to PT activities. Anticipate he will benefit from continued PT to achieve independent on stairs to enter on his own with HHPT    PT Assessment  Patient needs continued PT services    Follow Up Recommendations  Home health PT    Does the patient have the potential to tolerate intense rehabilitation      Barriers to Discharge        Equipment Recommendations  None recommended by PT    Recommendations for Other Services     Frequency Min 3X/week    Precautions / Restrictions     Pertinent Vitals/Pain C/o pain in back and 'tailbone'      Mobility  Bed Mobility Bed Mobility: Supine to Sit;Sit to Supine Supine to Sit: 6: Modified independent (Device/Increase time) Sit to Supine: 6: Modified independent (Device/Increase time) Details for Bed Mobility Assistance: pt uses log rolling technique.   takes extra time Transfers Transfers: Sit to Stand;Stand to Sit Sit to Stand: 6: Modified independent (Device/Increase time) Stand to Sit: 6: Modified independent (Device/Increase time) Details for Transfer Assistance: pt needs cues to push up with arms and reach back to control descent.  Pt able to repeat sit to stand 5 times Ambulation/Gait Ambulation/Gait Assistance: 4: Min assist Ambulation Distance (Feet): 75 Feet Assistive device: Rolling walker Ambulation/Gait Assistance Details: pt needs cues to stand tall and step up into RW Gait Pattern: Step-through pattern;Decreased step  length - right;Decreased step length - left;Decreased stride length;Trunk flexed Gait velocity: decreased General Gait Details: pt with generalized deconditioing and is limited by pain in back Stairs: No Wheelchair Mobility Wheelchair Mobility: No    Exercises Other Exercises Other Exercises: repeated sit to stand x 5 times with abd, glute and quad isometrics in standing   PT Diagnosis: Difficulty walking;Generalized weakness;Abnormality of gait  PT Problem List: Decreased strength;Decreased activity tolerance;Pain PT Treatment Interventions: DME instruction;Gait training;Stair training;Functional mobility training;Therapeutic activities;Therapeutic exercise;Patient/family education     PT Goals(Current goals can be found in the care plan section) Acute Rehab PT Goals Patient Stated Goal: to go home  PT Goal Formulation: With patient Time For Goal Achievement: 03/19/13 Potential to Achieve Goals: Good  Visit Information  Last PT Received On: 03/12/13 History of Present Illness: Jack Barrett is a 67 y.o. male with PMH of Metastatic Prostate CA, currently on Zytiga, DM, COPD, dysphagia, failure to thrive was brought to [place] ER 2 days ago per [facility] RN request due to purulent drainage from port site,        Prior Functioning  Home Living Family/patient expects to be discharged to:: Private residence Living Arrangements: Children Available Help at Discharge: Family;Available 24 hours/day Type of Home: Apartment Home Access: Stairs to enter Entrance Stairs-Number of Steps: 12 Entrance Stairs-Rails: Right;Left (pt reports he has help to go up and down steps) Home Layout: One level Home Equipment: Walker - 2 wheels Prior Function Level of Independence: Independent with assistive device(s) Communication Communication: No difficulties    Cognition  Cognition Arousal/Alertness: Awake/alert  Behavior During Therapy: WFL for tasks assessed/performed Overall Cognitive Status:  Within Functional Limits for tasks assessed    Extremity/Trunk Assessment Lower Extremity Assessment Lower Extremity Assessment: Overall WFL for tasks assessed Cervical / Trunk Assessment Cervical / Trunk Assessment: Other exceptions Cervical / Trunk Exceptions: pt reports pain in his back and tailbone from a fall he had   Balance Balance Balance Assessed: Yes Static Sitting Balance Static Sitting - Balance Support: No upper extremity supported;Feet supported Static Sitting - Level of Assistance: 7: Independent Static Standing Balance Static Standing - Balance Support: Bilateral upper extremity supported Static Standing - Level of Assistance: 6: Modified independent (Device/Increase time) Static Standing - Comment/# of Minutes: 3  End of Session PT - End of Session Equipment Utilized During Treatment: Gait belt Activity Tolerance: Patient tolerated treatment well Patient left: in bed Nurse Communication: Mobility status  GP    Bayard Hugger. Manson Passey, [place] [phone] 03/12/2013, 10:13 AM

## 2013-03-12 NOTE — Care Management Note (Signed)
   CARE MANAGEMENT NOTE 03/12/2013  Patient:  Jack Barrett, Jack Barrett   Account Number:  [id number]  Date Initiated:  03/09/2013  Documentation initiated by:  Mackson Botz  Subjective/Objective Assessment:   67 yo male admitted with infected port site.     Action/Plan:   Home when stable   Anticipated DC Date:     Anticipated DC Plan:  HOME W HOME HEALTH SERVICES      DC Planning Services  CM consult      Choice offered to / List presented to:  NA   DME arranged  NA      DME agency  NA     HH arranged  HH-1 RN  HH-2 PT  HH-3 OT  HH-4 NURSE'S AIDE      HH agency  [facility]   Status of service:  In process, will continue to follow Medicare Important Message given?   (If response is "NO", the following Medicare IM given date fields will be blank) Date Medicare IM given:   Date Additional Medicare IM given:    Discharge Disposition:    Per UR Regulation:  Reviewed for med. necessity/level of care/duration of stay  If discussed at Long Length of Stay Meetings, dates discussed:    Comments:  03/12/13 Roland Earl [phone] Resumption of care order entered. Cm consult for medication assistance. Pt only has rx coverage for medications related to prostate CA. MD made aware pt ineligible for medication assistance due to Medicare benefits. No other needs identified.   03/09/13 1400 Keondra Haydu,RN,MSN [phone] chart reviewed for utilization of services. Pt currently active with [facility] for RN/PT/OT/HHA/SW. Will need resumption of care order prior to discharge.

## 2013-03-12 NOTE — Progress Notes (Signed)
Patient discharged per MD order. IV removed by RN. Condom cath removed by patient himself. Both daughters at bedside - this RN explained discharge instructions and gave prescription for an antibiotic. One daughter, Nicolette, extremely upset that patient did not have any pain medications at home. This RN paged Dr. Arbutus Leas and received orders for both dilaudid and MS Cotin pills for patient at discharge. Total of 3 prescriptions given to patient (1 antibiotic and 2 pain medication). Educated family about MRSA from [facility] removal site - daughter, Burna Cash, extremely worried "mrsa will spread all over my house." This RN educated family about the importance of good hand washing and keeping the PAC site covered. Patient wheeled to the car by nurse tech for transport home. Angelena Form, RN

## 2013-03-12 NOTE — Telephone Encounter (Signed)
Received vm call from pt's daughter, Nicolette stating pt is being d/c today & she had left a message to refill his dilaudid on 02/05/13 & could have had it filled already.  Returned call & explained that we normally don't fill pain meds from the office if they are in the hospital.  She is very upset stating that she is in pt's room after being called to pick him up & he has pulled out his catheter & still have IV in & no one has talked with her about his care at home & she needs to know how to sterilize her home.  Instructed to talk with pt's nurse & if unable to talk with pt's nurse, then ask for charge nurse.  Informed that the hospitalist should take care of pain med at d/c.

## 2013-03-12 NOTE — Discharge Summary (Addendum)
Physician Discharge Summary  Jack Barrett ZOX:[id number] DOB: 04-21-1946 DOA: 03/08/2013  PCP: No PCP Per Patient  Admit date: 03/08/2013 Discharge date: 03/12/2013  Recommendations for Outpatient Follow-up:  1. Pt will need to follow up with PCP in 2 weeks post discharge 2. Please obtain BMP to evaluate electrolytes and kidney function 3. Please also check CBC to evaluate Hg and Hct levels 4. Patient has followup with Dr. Corliss Skains on 03/17/13   Discharge Diagnoses:  Principal Problem:   Infection due to port-a-cath Active Problems:   Prostate cancer, primary, with metastasis from prostate to other site   HTN (hypertension), benign   COPD (chronic obstructive pulmonary disease)   IDDM (insulin dependent diabetes mellitus)   Cancer associated pain   Infection due to portacath   Adult failure to thrive Portacath/Catheter tunnel infection  -wound culture= MRSA -surgically removed 9/30  -Continue vancomycin while in hospital -03/08/2013 blood cultures negative  -03/09/2013 tunnel culture--MRSA -Wound care as per surgery--continue daily wet to dry dressing changes until wound is healed.  -PRN dilaudid for breakthrough pain--Rx Dilaudid 8mg , #60, No RF--one po q4hrs prn pain -Continue MS Contin--Rx 60mg , #90, No RF, one TID -pt unable to afford Zyvox; insurance will not pay; family unable/unwilling to pay for zyvox -plan to send pt home with Bactrim DS, 2 tabs BID x 13 days -I spoke with case manager--Zyvox >$1000 Prostate Cancer  -F/u with Dr. Cyndie Chime as an outpatient.  -continue Zytiga  hypertension  -Continue amlodipine  Diabetes mellitus type 2  -given clinical scenario--will allow for more liberal glycemic control  -Hemoglobin A1C--6.6 Protein malnutrition  -Ensure BID with meals.  Code Status: Full Code  Family Communication: tried to call daughters--no answer   Discharge Condition: stable  Disposition: home  Diet:regular Wt Readings from Last 3 Encounters:   03/08/13 94.121 kg (207 lb 8 oz)  03/08/13 94.121 kg (207 lb 8 oz)  03/06/13 83.462 kg (184 lb)    History of present illness:  67 y.o. male with PMH of Metastatic Prostate CA, currently on Zytiga, DM, COPD, dysphagia, failure to thrive was brought to [place] ER 2 days prior to admit per [facility] RN request due to purulent drainage from port site, he was discharged home on Augmentin with advise to FU with Surgery. She didn't give him the Augmentin since he was taking some other abx, daughter unclear on details.  His daughter brings him back to the ER again today for the same complaint, she reports increased pain at the site of port. He is a very poor historian.  He denies any fevers, daughter reports overall gradual decline, physically cognitively over the past several months.  After discussions between EDP/Oncology/Surgery, [facility] was consulted for admission, daughter adamantly refused to take him home without port removal.     Consultants: Dr. Cyndie Chime  Discharge Exam: Filed Vitals:   03/12/13 1427  BP: 151/93  Pulse: 60  Temp: 98 F (36.7 C)  Resp: 16   Filed Vitals:   03/11/13 1401 03/11/13 2112 03/12/13 0425 03/12/13 1427  BP: 132/63 148/67 157/82 151/93  Pulse: 48 48 48 60  Temp: 98.4 F (36.9 C) 97.4 F (36.3 C) 97.3 F (36.3 C) 98 F (36.7 C)  TempSrc:  Oral Oral   Resp: 16 18 16 16   Height:      Weight:      SpO2: 96% 96% 98% 97%   General: Alert and awake, NAD, pleasant, cooperative Cardiovascular: RRR, no rub, no gallop, no S3 Respiratory: CTAB, no wheeze,  no rhonchi Abdomen:soft, nontender, nondistended, positive bowel sounds Extremities: No edema, No lymphangitis, no petechiae  Discharge Instructions       Future Appointments Provider Department Dept Phone   03/17/2013 10:20 AM Wilmon Arms. Tsuei, MD [facility], [place] [phone]       Medication List    STOP taking these medications       amoxicillin-clavulanate 875-125 MG per tablet   Commonly known as:  AUGMENTIN      TAKE these medications       abiraterone Acetate 250 MG tablet  Commonly known as:  ZYTIGA  Take 4 tablets (1,000 mg total) by mouth daily. Take on an empty stomach 1 hour before or 2 hours after a meal     ALPRAZolam 1 MG tablet  Commonly known as:  XANAX  Take 1 tablet (1 mg total) by mouth 3 (three) times daily as needed for sleep or anxiety.     amLODipine 5 MG tablet  Commonly known as:  NORVASC  Take 1 tablet (5 mg total) by mouth daily.     calcium carbonate 600 MG Tabs tablet  Commonly known as:  OS-CAL  Take 600 mg by mouth daily with breakfast.     cholecalciferol 1000 UNITS tablet  Commonly known as:  VITAMIN D  Take 1,000 Units by mouth daily.     HYDROmorphone 8 MG tablet  Commonly known as:  DILAUDID  Take 1 tablet (8 mg total) by mouth every 4 (four) hours as needed for pain.     morphine 60 MG 12 hr tablet  Commonly known as:  MS CONTIN  Take 1 tablet (60 mg total) by mouth 3 (three) times daily after meals.     ondansetron 8 MG disintegrating tablet  Commonly known as:  ZOFRAN ODT  Take 1 tablet (8 mg total) by mouth every 8 (eight) hours as needed for nausea.     predniSONE 5 MG tablet  Commonly known as:  DELTASONE  Take 1 tablet (5 mg total) by mouth 2 (two) times daily. Takes 5mg  twice a day     sulfamethoxazole-trimethoprim 800-160 MG per tablet  Commonly known as:  BACTRIM DS  Take 2 tablets by mouth 2 (two) times daily.         The results of significant diagnostics from this hospitalization (including imaging, microbiology, ancillary and laboratory) are listed below for reference.    Significant Diagnostic Studies: Dg Chest 2 View  03/06/2013   CLINICAL DATA:  Chest pain. History of hypertension and COPD.  EXAM: CHEST  2 VIEW  COMPARISON:  None.  FINDINGS: Cardiac silhouette is normal in size and configuration. No mediastinal or hilar masses.  The lungs are mildly hyperexpanded.  There are thickened  interstitial markings diffusely. This may all be chronic. A superimposed infiltrate is not excluded, however. There are no focal areas of airspace consolidation. No pleural effusion or pneumothorax is seen.  Right anterior chest wall power Port-A-Cath has its tip in the lower superior vena cava.  The bony thorax is demineralized but intact.  IMPRESSION: Diffusely thickened interstitial markings as well as lung hyperexpansion. This may all be chronic. A superimposed acute interstitial infiltrate is not excluded but felt less likely.   Electronically Signed   By: Amie Portland   On: 03/06/2013 21:05     Microbiology: Recent Results (from the past 240 hour(s))  CULTURE, BLOOD (ROUTINE X 2)     Status: None   Collection Time    03/08/13  12:24 PM      Result Value Range Status   Specimen Description BLOOD LEFT HAND  7 ML IN [facility] BOTTLE   Final   Special Requests NONE   Final   Culture  Setup Time     Final   Value: 03/08/2013 19:37     Performed at [facility]   Culture     Final   Value:        BLOOD CULTURE RECEIVED NO GROWTH TO DATE CULTURE WILL BE HELD FOR 5 DAYS BEFORE ISSUING A FINAL NEGATIVE REPORT     Performed at [facility]   Report Status PENDING   Incomplete  CULTURE, BLOOD (ROUTINE X 2)     Status: None   Collection Time    03/08/13  6:15 PM      Result Value Range Status   Specimen Description BLOOD LEFT ARM   Final   Special Requests BOTTLES DRAWN AEROBIC ONLY 5CC   Final   Culture  Setup Time     Final   Value: 03/09/2013 11:48     Performed at [facility]   Culture     Final   Value:        BLOOD CULTURE RECEIVED NO GROWTH TO DATE CULTURE WILL BE HELD FOR 5 DAYS BEFORE ISSUING A FINAL NEGATIVE REPORT     Performed at [facility]   Report Status PENDING   Incomplete  SURGICAL PCR SCREEN     Status: None   Collection Time    03/09/13  8:22 AM      Result Value Range Status   MRSA, PCR NEGATIVE  NEGATIVE Final   Staphylococcus aureus  NEGATIVE  NEGATIVE Final   Comment:            The Xpert SA Assay (FDA     approved for NASAL specimens     in patients over 31 years of age),     is one component of     a comprehensive surveillance     program.  Test performance has     been validated by [employer] [facility] for patients greater     than or equal to 30 year old.     It is not intended     to diagnose infection nor to     guide or monitor treatment.  CATH TIP CULTURE     Status: None   Collection Time    03/09/13 10:12 AM      Result Value Range Status   Specimen Description CATH TIP PORTACATH TIP   Final   Special Requests PATIENT ON FOLLOWING [facility]   Final   Culture     Final   Value: Culture reincubated for better growth     Performed at [facility]   Report Status PENDING   Incomplete  WOUND CULTURE     Status: None   Collection Time    03/09/13 10:25 AM      Result Value Range Status   Specimen Description DRAINAGE PORTACATH SITE RT NECK   Final   Special Requests PATIENT ON FOLLOWING [facility]   Final   Gram Stain     Final   Value: MODERATE WBC PRESENT,BOTH PMN AND MONONUCLEAR     RARE SQUAMOUS EPITHELIAL CELLS PRESENT     NO ORGANISMS SEEN     Performed at [facility]   Culture     Final   Value: FEW  METHICILLIN RESISTANT STAPHYLOCOCCUS AUREUS     Note: RIFAMPIN AND GENTAMICIN SHOULD NOT BE USED AS SINGLE DRUGS FOR TREATMENT OF STAPH INFECTIONS. CRITICAL RESULT CALLED TO, READ BACK BY AND VERIFIED WITH: KRISTEN RN BY INGRAM A 10/2 1145AM     Performed at [facility]   Report Status 03/12/2013 FINAL   Final   Organism ID, Bacteria METHICILLIN RESISTANT STAPHYLOCOCCUS AUREUS   Final  ANAEROBIC CULTURE     Status: None   Collection Time    03/09/13 10:25 AM      Result Value Range Status   Specimen Description DRAINAGE PORTACATH SITE RT NECK   Final   Special Requests PATIENT ON FOLLOWING [facility]   Final   Gram Stain     Final   Value: MODERATE WBC PRESENT,BOTH PMN AND MONONUCLEAR      NO SQUAMOUS EPITHELIAL CELLS SEEN     NO ORGANISMS SEEN     Performed at [facility]   Culture     Final   Value: NO ANAEROBES ISOLATED; CULTURE IN PROGRESS FOR 5 DAYS     Performed at [facility]   Report Status PENDING   Incomplete     Labs: Basic Metabolic Panel:  Recent Labs Lab 03/08/13 1230 03/09/13 0405 03/10/13 0340 03/12/13 0418  NA 135 136 135 137  K 4.0 3.9 3.9 4.5  CL 99 102 101 101  CO2 25 25 25 27   GLUCOSE 122* 149* 190* 137*  BUN 9 15 10 16   CREATININE 0.91 1.08 0.91 1.03  CALCIUM 8.7 8.3* 8.1* 8.6   Liver Function Tests:  Recent Labs Lab 03/09/13 0405  AST 7  ALT <5  ALKPHOS 102  BILITOT 0.2*  PROT 5.6*  ALBUMIN 2.3*   No results found for this basename: LIPASE, AMYLASE,  in the last 168 hours No results found for this basename: AMMONIA,  in the last 168 hours CBC:  Recent Labs Lab 03/08/13 1150 03/09/13 0405 03/10/13 0340 03/12/13 0418  WBC 7.8 6.6 5.0 4.9  NEUTROABS 6.1  --   --   --   HGB 12.5* 11.5* 11.4* 12.2*  HCT 38.8* 35.7* 35.0* 38.0*  MCV 87.6 87.3 86.8 86.2  PLT 195 137* 140* 137*   Cardiac Enzymes: No results found for this basename: CKTOTAL, CKMB, CKMBINDEX, TROPONINI,  in the last 168 hours BNP: No components found with this basename: POCBNP,  CBG:  Recent Labs Lab 03/11/13 1140 03/11/13 1742 03/11/13 2110 03/12/13 0726 03/12/13 1206  GLUCAP 159* 154* 134* 147* 152*    Time coordinating discharge:  Greater than 30 minutes  Signed:  Tsuneo Faison, DO [facility] Pager: [phone] 03/12/2013, 4:11 PM

## 2013-03-13 ENCOUNTER — Telehealth: Payer: Self-pay | Admitting: Oncology

## 2013-03-13 ENCOUNTER — Other Ambulatory Visit: Payer: Self-pay | Admitting: Oncology

## 2013-03-13 DIAGNOSIS — C61 Malignant neoplasm of prostate: Secondary | ICD-10-CM

## 2013-03-13 LAB — CATH TIP CULTURE

## 2013-03-13 NOTE — Telephone Encounter (Signed)
s.w. pt wife and advised on 10.15.14 appt....ok and aware

## 2013-03-14 LAB — CULTURE, BLOOD (ROUTINE X 2): Culture: NO GROWTH

## 2013-03-14 LAB — ANAEROBIC CULTURE

## 2013-03-15 ENCOUNTER — Encounter (HOSPITAL_COMMUNITY): Payer: Self-pay | Admitting: Emergency Medicine

## 2013-03-15 ENCOUNTER — Emergency Department (HOSPITAL_COMMUNITY)
Admission: EM | Admit: 2013-03-15 | Discharge: 2013-03-15 | Disposition: A | Discharge status: Home / Self Care | Payer: Medicare Other | Source: Home / Self Care | Attending: Emergency Medicine | Admitting: Emergency Medicine

## 2013-03-15 ENCOUNTER — Emergency Department (HOSPITAL_COMMUNITY): Payer: Medicare Other

## 2013-03-15 DIAGNOSIS — IMO0002 Reserved for concepts with insufficient information to code with codable children: Secondary | ICD-10-CM | POA: Insufficient documentation

## 2013-03-15 DIAGNOSIS — G893 Neoplasm related pain (acute) (chronic): Secondary | ICD-10-CM

## 2013-03-15 DIAGNOSIS — R5381 Other malaise: Secondary | ICD-10-CM | POA: Insufficient documentation

## 2013-03-15 DIAGNOSIS — J441 Chronic obstructive pulmonary disease with (acute) exacerbation: Secondary | ICD-10-CM | POA: Insufficient documentation

## 2013-03-15 DIAGNOSIS — Z872 Personal history of diseases of the skin and subcutaneous tissue: Secondary | ICD-10-CM | POA: Insufficient documentation

## 2013-03-15 DIAGNOSIS — Z79899 Other long term (current) drug therapy: Secondary | ICD-10-CM | POA: Insufficient documentation

## 2013-03-15 DIAGNOSIS — J705 Respiratory conditions due to smoke inhalation: Secondary | ICD-10-CM | POA: Insufficient documentation

## 2013-03-15 DIAGNOSIS — F17200 Nicotine dependence, unspecified, uncomplicated: Secondary | ICD-10-CM | POA: Insufficient documentation

## 2013-03-15 DIAGNOSIS — Z8546 Personal history of malignant neoplasm of prostate: Secondary | ICD-10-CM | POA: Insufficient documentation

## 2013-03-15 DIAGNOSIS — E1149 Type 2 diabetes mellitus with other diabetic neurological complication: Secondary | ICD-10-CM | POA: Insufficient documentation

## 2013-03-15 DIAGNOSIS — F341 Dysthymic disorder: Secondary | ICD-10-CM | POA: Insufficient documentation

## 2013-03-15 DIAGNOSIS — I10 Essential (primary) hypertension: Secondary | ICD-10-CM | POA: Insufficient documentation

## 2013-03-15 DIAGNOSIS — C61 Malignant neoplasm of prostate: Secondary | ICD-10-CM

## 2013-03-15 DIAGNOSIS — R0602 Shortness of breath: Secondary | ICD-10-CM | POA: Insufficient documentation

## 2013-03-15 DIAGNOSIS — G909 Disorder of the autonomic nervous system, unspecified: Secondary | ICD-10-CM | POA: Insufficient documentation

## 2013-03-15 LAB — CBC WITH DIFFERENTIAL/PLATELET
Basophils Relative: 0 % (ref 0–1)
HCT: 40.9 % (ref 39.0–52.0)
Hemoglobin: 13.4 g/dL (ref 13.0–17.0)
Lymphocytes Relative: 27 % (ref 12–46)
MCH: 27.9 pg (ref 26.0–34.0)
MCHC: 32.8 g/dL (ref 30.0–36.0)
Monocytes Absolute: 0.7 10*3/uL (ref 0.1–1.0)
Monocytes Relative: 12 % (ref 3–12)
Neutro Abs: 3.4 10*3/uL (ref 1.7–7.7)
WBC: 5.7 10*3/uL (ref 4.0–10.5)

## 2013-03-15 LAB — BASIC METABOLIC PANEL
BUN: 9 mg/dL (ref 6–23)
CO2: 25 mEq/L (ref 19–32)
Chloride: 98 mEq/L (ref 96–112)
Creatinine, Ser: 0.9 mg/dL (ref 0.50–1.35)
Glucose, Bld: 135 mg/dL — ABNORMAL HIGH (ref 70–99)
Sodium: 135 mEq/L (ref 135–145)

## 2013-03-15 LAB — CULTURE, BLOOD (ROUTINE X 2)

## 2013-03-15 MED ORDER — ALPRAZOLAM 1 MG PO TABS: 1.0000 mg | ORAL_TABLET | Freq: Three times a day (TID) | ORAL | Status: DC | PRN

## 2013-03-15 MED ORDER — SODIUM CHLORIDE 0.9 % IJ SOLN
3.0000 mL | Freq: Two times a day (BID) | INTRAMUSCULAR | Status: DC
  Administered 2013-03-15: 3 mL via INTRAVENOUS

## 2013-03-15 MED ORDER — ALBUTEROL SULFATE (5 MG/ML) 0.5% IN NEBU
5.0000 mg | INHALATION_SOLUTION | Freq: Once | RESPIRATORY_TRACT | Status: AC
  Administered 2013-03-15: 5 mg via RESPIRATORY_TRACT
  Filled 2013-03-15: qty 1

## 2013-03-15 MED ORDER — MORPHINE SULFATE ER 60 MG PO TBCR: 60.0000 mg | EXTENDED_RELEASE_TABLET | Freq: Three times a day (TID) | ORAL | Status: DC

## 2013-03-15 MED ORDER — SODIUM CHLORIDE 0.9 % IV SOLN: 250.0000 mL | INTRAVENOUS | Status: DC | PRN

## 2013-03-15 MED ORDER — SODIUM CHLORIDE 0.9 % IJ SOLN: 3.0000 mL | INTRAMUSCULAR | Status: DC | PRN

## 2013-03-15 MED ORDER — HYDROMORPHONE HCL 8 MG PO TABS: 8.0000 mg | ORAL_TABLET | ORAL | Status: DC | PRN

## 2013-03-15 NOTE — ED Notes (Signed)
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
## 2013-03-15 NOTE — ED Notes (Signed)
[facility]: Wyvonnia Lora: [phone]  If any medical information/history is needed, please call and talk with triage nurse who can provide information.
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
## 2013-03-17 ENCOUNTER — Encounter (HOSPITAL_COMMUNITY): Payer: Self-pay

## 2013-03-17 ENCOUNTER — Telehealth: Payer: Self-pay | Admitting: *Deleted

## 2013-03-17 ENCOUNTER — Ambulatory Visit (INDEPENDENT_AMBULATORY_CARE_PROVIDER_SITE_OTHER): Payer: Medicare Other | Admitting: Surgery

## 2013-03-17 ENCOUNTER — Inpatient Hospital Stay (HOSPITAL_COMMUNITY)
Admission: AD | Admit: 2013-03-17 | Discharge: 2013-03-20 | DRG: 871 | Disposition: A | Discharge status: Skilled Nursing Facility | Payer: Medicare Other | Source: Ambulatory Visit | Attending: Internal Medicine | Admitting: Internal Medicine

## 2013-03-17 DIAGNOSIS — E1149 Type 2 diabetes mellitus with other diabetic neurological complication: Secondary | ICD-10-CM | POA: Diagnosis present

## 2013-03-17 DIAGNOSIS — IMO0001 Reserved for inherently not codable concepts without codable children: Secondary | ICD-10-CM

## 2013-03-17 DIAGNOSIS — E43 Unspecified severe protein-calorie malnutrition: Secondary | ICD-10-CM | POA: Diagnosis present

## 2013-03-17 DIAGNOSIS — R5381 Other malaise: Secondary | ICD-10-CM | POA: Diagnosis present

## 2013-03-17 DIAGNOSIS — G909 Disorder of the autonomic nervous system, unspecified: Secondary | ICD-10-CM | POA: Diagnosis present

## 2013-03-17 DIAGNOSIS — C7951 Secondary malignant neoplasm of bone: Secondary | ICD-10-CM | POA: Diagnosis present

## 2013-03-17 DIAGNOSIS — J449 Chronic obstructive pulmonary disease, unspecified: Secondary | ICD-10-CM | POA: Diagnosis present

## 2013-03-17 DIAGNOSIS — G893 Neoplasm related pain (acute) (chronic): Secondary | ICD-10-CM | POA: Diagnosis present

## 2013-03-17 DIAGNOSIS — Z794 Long term (current) use of insulin: Secondary | ICD-10-CM

## 2013-03-17 DIAGNOSIS — F05 Delirium due to known physiological condition: Secondary | ICD-10-CM | POA: Diagnosis present

## 2013-03-17 DIAGNOSIS — E119 Type 2 diabetes mellitus without complications: Secondary | ICD-10-CM

## 2013-03-17 DIAGNOSIS — F17200 Nicotine dependence, unspecified, uncomplicated: Secondary | ICD-10-CM | POA: Diagnosis present

## 2013-03-17 DIAGNOSIS — J4489 Other specified chronic obstructive pulmonary disease: Secondary | ICD-10-CM | POA: Diagnosis present

## 2013-03-17 DIAGNOSIS — I10 Essential (primary) hypertension: Secondary | ICD-10-CM | POA: Diagnosis present

## 2013-03-17 DIAGNOSIS — T50995A Adverse effect of other drugs, medicaments and biological substances, initial encounter: Secondary | ICD-10-CM | POA: Diagnosis present

## 2013-03-17 DIAGNOSIS — C61 Malignant neoplasm of prostate: Secondary | ICD-10-CM | POA: Diagnosis present

## 2013-03-17 DIAGNOSIS — Z96649 Presence of unspecified artificial hip joint: Secondary | ICD-10-CM

## 2013-03-17 DIAGNOSIS — T80219D Unspecified infection due to central venous catheter, subsequent encounter: Secondary | ICD-10-CM

## 2013-03-17 DIAGNOSIS — R627 Adult failure to thrive: Secondary | ICD-10-CM | POA: Diagnosis present

## 2013-03-17 DIAGNOSIS — F19921 Other psychoactive substance use, unspecified with intoxication with delirium: Secondary | ICD-10-CM | POA: Diagnosis present

## 2013-03-17 DIAGNOSIS — R61 Generalized hyperhidrosis: Secondary | ICD-10-CM | POA: Diagnosis present

## 2013-03-17 DIAGNOSIS — Z8614 Personal history of Methicillin resistant Staphylococcus aureus infection: Secondary | ICD-10-CM

## 2013-03-17 DIAGNOSIS — Z79899 Other long term (current) drug therapy: Secondary | ICD-10-CM

## 2013-03-17 DIAGNOSIS — A419 Sepsis, unspecified organism: Principal | ICD-10-CM | POA: Diagnosis present

## 2013-03-17 DIAGNOSIS — R634 Abnormal weight loss: Secondary | ICD-10-CM | POA: Diagnosis present

## 2013-03-17 LAB — COMPREHENSIVE METABOLIC PANEL
Albumin: 2.9 g/dL — ABNORMAL LOW (ref 3.5–5.2)
BUN: 14 mg/dL (ref 6–23)
Calcium: 9.2 mg/dL (ref 8.4–10.5)
Creatinine, Ser: 1.22 mg/dL (ref 0.50–1.35)
GFR calc non Af Amer: 61 mL/min — ABNORMAL LOW (ref >90)
Potassium: 4.7 mEq/L (ref 3.5–5.1)
Total Protein: 6.9 g/dL (ref 6.0–8.3)

## 2013-03-17 LAB — CBC WITH DIFFERENTIAL/PLATELET
Basophils Relative: 0 % (ref 0–1)
Eosinophils Relative: 0 % (ref 0–5)
HCT: 39.3 % (ref 39.0–52.0)
Hemoglobin: 12.9 g/dL — ABNORMAL LOW (ref 13.0–17.0)
Lymphocytes Relative: 12 % (ref 12–46)
Lymphs Abs: 0.9 10*3/uL (ref 0.7–4.0)
MCHC: 32.8 g/dL (ref 30.0–36.0)
MCV: 85.1 fL (ref 78.0–100.0)
Monocytes Absolute: 0.6 10*3/uL (ref 0.1–1.0)
Monocytes Relative: 8 % (ref 3–12)
Platelets: 161 10*3/uL (ref 150–400)
RBC: 4.62 MIL/uL (ref 4.22–5.81)
WBC: 7.6 10*3/uL (ref 4.0–10.5)

## 2013-03-17 LAB — GLUCOSE, CAPILLARY

## 2013-03-17 MED ORDER — MORPHINE SULFATE ER 60 MG PO TBCR
60.0000 mg | EXTENDED_RELEASE_TABLET | Freq: Three times a day (TID) | ORAL | Status: DC
  Administered 2013-03-18 – 2013-03-20 (×8): 60 mg via ORAL
  Filled 2013-03-17 (×8): qty 2

## 2013-03-17 MED ORDER — SODIUM CHLORIDE 0.9 % IV SOLN
INTRAVENOUS | Status: DC
  Administered 2013-03-17: 21:00:00 via INTRAVENOUS
  Administered 2013-03-18: 980 mL via INTRAVENOUS

## 2013-03-17 MED ORDER — CALCIUM CARBONATE 600 MG PO TABS
600.0000 mg | ORAL_TABLET | Freq: Every day | ORAL | Status: DC
  Filled 2013-03-17: qty 1

## 2013-03-17 MED ORDER — ALPRAZOLAM 1 MG PO TABS
1.0000 mg | ORAL_TABLET | Freq: Three times a day (TID) | ORAL | Status: DC | PRN
  Administered 2013-03-20: 1 mg via ORAL
  Filled 2013-03-17: qty 1

## 2013-03-17 MED ORDER — INSULIN ASPART 100 UNIT/ML ~~LOC~~ SOLN
0.0000 [IU] | Freq: Three times a day (TID) | SUBCUTANEOUS | Status: DC
  Administered 2013-03-18 (×2): 2 [IU] via SUBCUTANEOUS
  Administered 2013-03-19 – 2013-03-20 (×2): 3 [IU] via SUBCUTANEOUS
  Administered 2013-03-20: 2 [IU] via SUBCUTANEOUS

## 2013-03-17 MED ORDER — ACETAMINOPHEN 325 MG PO TABS: 650.0000 mg | ORAL_TABLET | Freq: Four times a day (QID) | ORAL | Status: DC | PRN

## 2013-03-17 MED ORDER — HYDROMORPHONE HCL 4 MG PO TABS
8.0000 mg | ORAL_TABLET | ORAL | Status: DC | PRN
  Administered 2013-03-17 – 2013-03-20 (×10): 8 mg via ORAL
  Filled 2013-03-17 (×10): qty 2

## 2013-03-17 MED ORDER — ACETAMINOPHEN 650 MG RE SUPP: 650.0000 mg | Freq: Four times a day (QID) | RECTAL | Status: DC | PRN

## 2013-03-17 MED ORDER — SULFAMETHOXAZOLE-TMP DS 800-160 MG PO TABS
2.0000 | ORAL_TABLET | Freq: Two times a day (BID) | ORAL | Status: DC
  Administered 2013-03-17 – 2013-03-18 (×2): 2 via ORAL
  Filled 2013-03-17 (×3): qty 2

## 2013-03-17 MED ORDER — CALCIUM CARBONATE 1250 (500 CA) MG PO TABS
1.0000 | ORAL_TABLET | Freq: Every day | ORAL | Status: DC
  Administered 2013-03-18 – 2013-03-20 (×3): 500 mg via ORAL
  Filled 2013-03-17 (×4): qty 1

## 2013-03-17 MED ORDER — AMLODIPINE BESYLATE 5 MG PO TABS
5.0000 mg | ORAL_TABLET | Freq: Every day | ORAL | Status: DC
  Administered 2013-03-17 – 2013-03-18 (×2): 5 mg via ORAL
  Filled 2013-03-17 (×3): qty 1

## 2013-03-17 MED ORDER — PREDNISONE 5 MG PO TABS
5.0000 mg | ORAL_TABLET | Freq: Two times a day (BID) | ORAL | Status: DC
  Administered 2013-03-17 – 2013-03-20 (×6): 5 mg via ORAL
  Filled 2013-03-17 (×7): qty 1

## 2013-03-17 MED ORDER — ABIRATERONE ACETATE 250 MG PO TABS
1000.0000 mg | ORAL_TABLET | Freq: Every day | ORAL | Status: DC
  Administered 2013-03-18 – 2013-03-20 (×3): 1000 mg via ORAL

## 2013-03-17 MED ORDER — INFLUENZA VAC SPLIT QUAD 0.5 ML IM SUSP
0.5000 mL | INTRAMUSCULAR | Status: AC
  Filled 2013-03-17 (×2): qty 0.5

## 2013-03-17 MED ORDER — HEPARIN SODIUM (PORCINE) 5000 UNIT/ML IJ SOLN
5000.0000 [IU] | Freq: Three times a day (TID) | INTRAMUSCULAR | Status: DC
  Administered 2013-03-17 – 2013-03-20 (×7): 5000 [IU] via SUBCUTANEOUS
  Filled 2013-03-17 (×11): qty 1

## 2013-03-17 MED ORDER — INSULIN ASPART 100 UNIT/ML ~~LOC~~ SOLN: 0.0000 [IU] | Freq: Every day | SUBCUTANEOUS | Status: DC

## 2013-03-17 MED ORDER — ONDANSETRON HCL 4 MG PO TABS: 4.0000 mg | ORAL_TABLET | Freq: Four times a day (QID) | ORAL | Status: DC | PRN

## 2013-03-17 MED ORDER — POLYETHYLENE GLYCOL 3350 17 G PO PACK
17.0000 g | PACK | Freq: Every day | ORAL | Status: DC | PRN
  Filled 2013-03-17: qty 1

## 2013-03-17 MED ORDER — ONDANSETRON HCL 4 MG/2ML IJ SOLN
4.0000 mg | Freq: Four times a day (QID) | INTRAMUSCULAR | Status: DC | PRN
  Administered 2013-03-18 – 2013-03-20 (×4): 4 mg via INTRAVENOUS
  Filled 2013-03-17 (×4): qty 2

## 2013-03-17 MED ORDER — VITAMIN D3 25 MCG (1000 UNIT) PO TABS
1000.0000 [IU] | ORAL_TABLET | Freq: Every day | ORAL | Status: DC
  Administered 2013-03-18 – 2013-03-20 (×3): 1000 [IU] via ORAL
  Filled 2013-03-17 (×3): qty 1

## 2013-03-17 NOTE — Telephone Encounter (Signed)
Pt daughter, Nicolette (ROI on file) called requesting MD to direct admit pt for 3 days so he can qualify for SNF.  She states "I'm just not able to handle this" She reports pt is not eating, hard for him to shallow pills, she is not comfortable doing wound care [facility] does 3x/week)  Daughter states she is suppose to change dressing 2x per week but really doesn't feel good about this with MRSA and staph infections (?)  Pt states social worker coming out today from [facility].  Note to MD.

## 2013-03-17 NOTE — H&P (Signed)
[facility] History and Physical  HAI GRABE WJX:[id number] DOB: [birth year] DOA: 03/17/2013  Referring physician: Dr. Cyndie Chime PCP: No PCP Per Patient  Specialists:   Chief Complaint: Failure to thrive  HPI: Jack Barrett is a 67 y.o. male  With a hx of prostate cancer followed by oncology who was recently noted by [facility] to have marked functional decline and decreased PO intake. Pt's daughter was also concerned that she would no longer be able to take care of her mother. The patient was subsequently directly admitted to the hospital for 3 days so he can qualify for SNF.  Review of Systems:  As per above, remainder of 10pt ros reviewed and are neg  Past Medical History  Diagnosis Date  . Prostate cancer, primary, with metastasis from prostate to other site 01/05/2013  . HTN (hypertension), benign 01/15/2013  . COPD (chronic obstructive pulmonary disease) 01/15/2013  . IDDM (insulin dependent diabetes mellitus) 01/15/2013  . 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 Cancer associated pain 01/15/2013  . Status post right hip replacement 01/15/2013  . Chronic ulcer of gastric fundus 01/15/2013    EGD 11/24/12  . Depression, reactive 01/15/2013  . Decubitus ulcer    Past Surgical History  Procedure Laterality Date  . Total hip arthroplasty    . Prostate surgery    . Porta cath    . Skin grafts    . Port-a-cath removal Right 03/09/2013    Procedure: REMOVAL PORT-A-CATH;  Surgeon: Robyne Askew, MD;  Location: [facility];  Service: General;  Laterality: Right;   Social History:  reports that he has been smoking Cigarettes.  He has been smoking about 1.00 pack per day. He has never used smokeless tobacco. He reports that he does not drink alcohol or use illicit drugs.  where does patient live--home, ALF, SNF? and with whom if at home?  Can patient participate in ADLs?  Allergies  Allergen Reactions  . Aspirin Other (See Comments)    hemmoriage  . Codeine  Other (See Comments)    hallucinations  . Lactose Intolerance (Gi) Diarrhea    No family history on file.  (be sure to complete)  Prior to Admission medications   Medication Sig Start Date End Date Taking? Authorizing Provider  abiraterone Acetate (ZYTIGA) 250 MG tablet Take 4 tablets (1,000 mg total) by mouth daily. Take on an empty stomach 1 hour before or 2 hours after a meal 01/14/13   [name], MD  ALPRAZolam Prudy Feeler) 1 MG tablet Take 1 tablet (1 mg total) by mouth 3 (three) times daily as needed for sleep or anxiety. 03/15/13   Hurman Horn, MD  amLODipine (NORVASC) 5 MG tablet Take 1 tablet (5 mg total) by mouth daily. 01/14/13   [name], MD  calcium carbonate (OS-CAL) 600 MG TABS tablet Take 600 mg by mouth daily with breakfast.    Historical Provider, MD  cholecalciferol (VITAMIN D) 1000 UNITS tablet Take 1,000 Units by mouth daily.    Historical Provider, MD  HYDROmorphone (DILAUDID) 8 MG tablet Take 1 tablet (8 mg total) by mouth every 4 (four) hours as needed for pain. 03/15/13   Hurman Horn, MD  morphine (MS CONTIN) 60 MG 12 hr tablet Take 1 tablet (60 mg total) by mouth 3 (three) times daily after meals. 03/15/13   Hurman Horn, MD  ondansetron (ZOFRAN ODT) 8 MG disintegrating tablet Take 1 tablet (8 mg total) by mouth every  8 (eight) hours as needed for nausea. 01/14/13   [name], MD  predniSONE (DELTASONE) 5 MG tablet Take 1 tablet (5 mg total) by mouth 2 (two) times daily. Takes 5mg  twice a day 01/14/13   [name], MD  sulfamethoxazole-trimethoprim (BACTRIM DS) 800-160 MG per tablet Take 2 tablets by mouth 2 (two) times daily. 03/12/13   Catarina Hartshorn, MD   Physical Exam: Filed Vitals:   03/17/13 1811  BP: 167/76  Pulse: 76  Temp: 98.7 F (37.1 C)  TempSrc: Oral  Resp: 16  Height: 6\' 7"  (2.007 m)  Weight: 79.833 kg (176 lb)  SpO2: 92%     General:  Awake, in nad  Eyes: PERRL B  ENT: membranes dry, dentition fair  Neck: trachea  midline, neck supple  Cardiovascular: regular, s1, s2  Respiratory: normal resp effort, no wheezing  Abdomen: soft,nondistended  Skin: normal skin turgor, no abnormal skin lesions seen  Musculoskeletal: perfused, no clubbing  Psychiatric: mood/affect normal // no auditory/visual hallucinations  Neurologic: cn2-12 grossly intact, strength/sensation intact  Labs on Admission:  Basic Metabolic Panel:  Recent Labs Lab 03/12/13 0418 03/15/13 1205  NA 137 135  K 4.5 3.9  CL 101 98  CO2 27 25  GLUCOSE 137* 135*  BUN 16 9  CREATININE 1.03 0.90  CALCIUM 8.6 9.1   Liver Function Tests: No results found for this basename: AST, ALT, ALKPHOS, BILITOT, PROT, ALBUMIN,  in the last 168 hours No results found for this basename: LIPASE, AMYLASE,  in the last 168 hours No results found for this basename: AMMONIA,  in the last 168 hours CBC:  Recent Labs Lab 03/12/13 0418 03/15/13 1205  WBC 4.9 5.7  NEUTROABS  --  3.4  HGB 12.2* 13.4  HCT 38.0* 40.9  MCV 86.2 85.2  PLT 137* 173   Cardiac Enzymes: No results found for this basename: CKTOTAL, CKMB, CKMBINDEX, TROPONINI,  in the last 168 hours  BNP (last 3 results) No results found for this basename: PROBNP,  in the last 8760 hours CBG:  Recent Labs Lab 03/11/13 1742 03/11/13 2110 03/12/13 0726 03/12/13 1206 03/15/13 1505  GLUCAP 154* 134* 147* 152* 172*    Radiological Exams on Admission: No results found.  Assessment/Plan Principal Problem:   Adult failure to thrive Active Problems:   Prostate cancer, primary, with metastasis from prostate to other site   HTN (hypertension), benign   COPD (chronic obstructive pulmonary disease)   IDDM (insulin dependent diabetes mellitus)   1. Failure to thrive 1. Admit to med floor 2. Will consult PT/OT/SW 3. Cont on IVF and encourage PO as tolerated 4. May consider Palliative Care consult 2. Prostate cancer 1. Per Oncology 3. HTN 1. BP stable 2. Cont  meds 4. DM 1. Cont on SSI coverage 5. Hx COPD 1. No wheezing 2. Monitor for now. On minimal O2 support 6. DVT prophylaxis 1. Heparin subQ  Code Status: Ful (must indicate code status--if unknown or must be presumed, indicate so) Family Communication: Pt in room (indicate person spoken with, if applicable, with phone number if by telephone) Disposition Plan: Pending (indicate anticipated LOS)  Time spent:  Lerae Langham K [facility] Pager [phone]  If 7PM-7AM, please contact night-coverage [email or web address] Password [facility] 03/17/2013, 6:17 PM

## 2013-03-17 NOTE — Telephone Encounter (Signed)
Called and spoke with Nicolette, pt's daughter, Per Dr. Cyndie Chime bedcontrol will be calling when room is ready for pt to be direct admit.  Nicolette verbalized understanding and stated she would be waiting for them to call.

## 2013-03-17 NOTE — Telephone Encounter (Signed)
Julie--[facility] called to report she visited pt yesterday; found pt in bed-fetal position. Room in disarray with dirty depends on floor. Pt stating he hasn't ate since night before.  Raynelle Fanning woke daughter up and spoke with her regarding condition of pt and needs that need to be addressed.  Raynelle Fanning reports that daughter keeps all pt meds in her bedroom; Raynelle Fanning reconciled all meds and they checked out fine.  Pt states he has been vomiting-not able to keep anything on stomach.  Raynelle Fanning went over meds with daughter and how to dispense regarding PRN meds. When daughter question about pt blood sugars, she stated she can't find glucometer; Raynelle Fanning instructed daughter to find glucometer and check sugars on regular schedule.   Raynelle Fanning requesting MD be made aware and asking about possible hospice care?  Note to MD.

## 2013-03-18 DIAGNOSIS — R634 Abnormal weight loss: Secondary | ICD-10-CM | POA: Diagnosis present

## 2013-03-18 DIAGNOSIS — R61 Generalized hyperhidrosis: Secondary | ICD-10-CM | POA: Diagnosis present

## 2013-03-18 DIAGNOSIS — C7951 Secondary malignant neoplasm of bone: Secondary | ICD-10-CM

## 2013-03-18 DIAGNOSIS — C61 Malignant neoplasm of prostate: Secondary | ICD-10-CM

## 2013-03-18 DIAGNOSIS — G893 Neoplasm related pain (acute) (chronic): Secondary | ICD-10-CM

## 2013-03-18 DIAGNOSIS — F05 Delirium due to known physiological condition: Secondary | ICD-10-CM | POA: Diagnosis present

## 2013-03-18 DIAGNOSIS — E43 Unspecified severe protein-calorie malnutrition: Secondary | ICD-10-CM | POA: Diagnosis present

## 2013-03-18 DIAGNOSIS — R7881 Bacteremia: Secondary | ICD-10-CM | POA: Insufficient documentation

## 2013-03-18 LAB — GLUCOSE, CAPILLARY
Glucose-Capillary: 143 mg/dL — ABNORMAL HIGH (ref 70–99)
Glucose-Capillary: 115 mg/dL — ABNORMAL HIGH (ref 70–99)
Glucose-Capillary: 136 mg/dL — ABNORMAL HIGH (ref 70–99)
Glucose-Capillary: 93 mg/dL (ref 70–99)

## 2013-03-18 LAB — BASIC METABOLIC PANEL
CO2: 26 mEq/L (ref 19–32)
Calcium: 8.7 mg/dL (ref 8.4–10.5)
GFR calc non Af Amer: 63 mL/min — ABNORMAL LOW (ref >90)
Glucose, Bld: 131 mg/dL — ABNORMAL HIGH (ref 70–99)
Sodium: 133 mEq/L — ABNORMAL LOW (ref 135–145)

## 2013-03-18 LAB — CBC
MCH: 27.7 pg (ref 26.0–34.0)
MCV: 85 fL (ref 78.0–100.0)
Platelets: 171 10*3/uL (ref 150–400)
RBC: 4.41 MIL/uL (ref 4.22–5.81)

## 2013-03-18 MED ORDER — PIPERACILLIN-TAZOBACTAM 3.375 G IVPB 30 MIN
3.3750 g | Freq: Once | INTRAVENOUS | Status: AC
  Administered 2013-03-18: 3.375 g via INTRAVENOUS
  Filled 2013-03-18: qty 50

## 2013-03-18 MED ORDER — PROMETHAZINE HCL 25 MG/ML IJ SOLN
25.0000 mg | INTRAMUSCULAR | Status: DC | PRN
  Administered 2013-03-18 – 2013-03-20 (×4): 25 mg via INTRAVENOUS
  Filled 2013-03-18 (×4): qty 1

## 2013-03-18 MED ORDER — VANCOMYCIN HCL 10 G IV SOLR
1250.0000 mg | Freq: Once | INTRAVENOUS | Status: AC
  Administered 2013-03-18: 1250 mg via INTRAVENOUS
  Filled 2013-03-18: qty 1250

## 2013-03-18 MED ORDER — VANCOMYCIN HCL IN DEXTROSE 1-5 GM/200ML-% IV SOLN
1000.0000 mg | Freq: Two times a day (BID) | INTRAVENOUS | Status: DC
  Administered 2013-03-19 – 2013-03-20 (×3): 1000 mg via INTRAVENOUS
  Filled 2013-03-18 (×4): qty 200

## 2013-03-18 MED ORDER — PIPERACILLIN-TAZOBACTAM 3.375 G IVPB
3.3750 g | Freq: Three times a day (TID) | INTRAVENOUS | Status: DC
  Administered 2013-03-19 – 2013-03-20 (×5): 3.375 g via INTRAVENOUS
  Filled 2013-03-18 (×6): qty 50

## 2013-03-18 NOTE — Evaluation (Signed)
Occupational Therapy Evaluation Patient Details Name: KALLUM JORGENSEN MRN: [id number] DOB: 02/02/1946 Today's Date: 03/18/2013 Time: 4098-1191 OT Time Calculation (min): 42 min  OT Assessment / Plan / Recommendation History of present illness ROYE GUSTAFSON is an 67 y.o. male who was directly admitted on 03/17/2013 secondary to functional decline, diminished oral intake in the setting of prostate cancer and recent hospitalization 03/08/2013-03/12/2013 with a Port-A-Cath MRSA infection. Port-A-Cath was surgically removed on 03/10/2013. He was sent home on Bactrim therapy.   Clinical Impression   Pt presents to OT with decreased I with all ADL activity. Pt also reports daughter was not taking care of pt or providing food for pt. Pt will benefit from SNF for rehab to increase I with ADL activity to return to PLOF    OT Assessment  Patient needs continued OT Services    Follow Up Recommendations  SNF;Supervision/Assistance - 24 hour       Equipment Recommendations  None recommended by OT       Frequency  Min 2X/week    Precautions / Restrictions Precautions Precautions: Fall       ADL  Eating/Feeding: Set up Where Assessed - Eating/Feeding: Edge of bed Grooming: Set up Where Assessed - Grooming: Unsupported sitting Upper Body Bathing: Moderate assistance Where Assessed - Upper Body Bathing: Unsupported sitting Lower Body Bathing: Maximal assistance Where Assessed - Lower Body Bathing: Supported sit to stand Toilet Transfer: Maximal assistance Toilet Transfer Method: Sit to Barista: Other (comment) (urinal) Toileting - Clothing Manipulation and Hygiene: Maximal assistance Where Assessed - Toileting Clothing Manipulation and Hygiene: Standing    OT Diagnosis: Generalized weakness  OT Problem List: Decreased strength;Decreased activity tolerance;Impaired balance (sitting and/or standing) OT Treatment Interventions: Self-care/ADL training;Patient/family  education;DME and/or AE instruction   OT Goals(Current goals can be found in the care plan section) Acute Rehab OT Goals Patient Stated Goal: to go somewhere will i will have help OT Goal Formulation: With patient Time For Goal Achievement: 04/01/13 Potential to Achieve Goals: Good  Visit Information  Last OT Received On: 03/18/13 Assistance Needed: +2 History of Present Illness: PHAROAH GOGGINS is an 67 y.o. male who was directly admitted on 03/17/2013 secondary to functional decline, diminished oral intake in the setting of prostate cancer and recent hospitalization 03/08/2013-03/12/2013 with a Port-A-Cath MRSA infection. Port-A-Cath was surgically removed on 03/10/2013. He was sent home on Bactrim therapy.       Prior Functioning     Home Living Family/patient expects to be discharged to:: Skilled nursing facility Living Arrangements: Children Available Help at Discharge: Family;Available 24 hours/day Type of Home: Apartment Home Access: Stairs to enter Entrance Stairs-Number of Steps: 12 Entrance Stairs-Rails: Right;Left (pt reports he has help to go up and down steps) Home Layout: One level Home Equipment: Walker - 2 wheels Prior Function Level of Independence: Independent with assistive device(s) Communication Communication: No difficulties         Vision/Perception Vision - History Patient Visual Report: No change from baseline   Cognition  Cognition Arousal/Alertness: Awake/alert Behavior During Therapy: WFL for tasks assessed/performed Overall Cognitive Status: Within Functional Limits for tasks assessed    Extremity/Trunk Assessment Upper Extremity Assessment Upper Extremity Assessment: Generalized weakness     Mobility Bed Mobility Bed Mobility: Supine to Sit Supine to Sit: 5: Supervision;HOB elevated Sit to Supine: HOB elevated Transfers Transfers: Sit to Stand;Stand to Sit Sit to Stand: From bed;2: Max assist Stand to Sit: To bed;2: Max  assist Details for Transfer Assistance:  pt became dizzy upon standing and needed to sit           End of Session OT - End of Session Activity Tolerance: Patient limited by fatigue Pt left in bed with call bell with in reach  GO     Cletis Muma, [place] Kung 03/18/2013, 10:49 AM

## 2013-03-18 NOTE — Progress Notes (Addendum)
Clinical Social Work Department CLINICAL SOCIAL WORK PLACEMENT NOTE 03/18/2013  Patient:  AVANISH, CERULLO  Account Number:  [id number] Admit date:  03/17/2013  Clinical Social Worker:  Jacelyn Grip  Date/time:  03/18/2013 01:34 PM  Clinical Social Work is seeking post-discharge placement for this patient at the following level of care:   SKILLED NURSING   (*CSW will update this form in Epic as items are completed)   03/18/2013  Patient/family provided with [facility] Department of Clinical Social Work's list of facilities offering this level of care within the geographic area requested by the patient (or if unable, by the patient's family).  03/18/2013  Patient/family informed of their freedom to choose among providers that offer the needed level of care, that participate in Medicare, Medicaid or managed care program needed by the patient, have an available bed and are willing to accept the patient.  03/18/2013  Patient/family informed of [facility]' ownership interest in [facility], as well as of the fact that they are under no obligation to receive care at this facility.  PASARR submitted to EDS on 03/18/2013 PASARR number received from EDS on 03/18/2013  FL2 transmitted to all facilities in geographic area requested by pt/family on  03/18/2013 FL2 transmitted to all facilities within larger geographic area on   Patient informed that his/her managed care company has contracts with or will negotiate with  certain facilities, including the following:     Patient/family informed of bed offers received:  03/19/2013 Patient chooses bed at  Physician recommends and patient chooses bed at    Patient to be transferred to  on   Patient to be transferred to facility by   The following physician request were entered in Epic:   Additional Comments:   [facility] Clinical Social Work Intern [place] [place] [phone]   Jacklynn Lewis, MSW, LCSWA  Clinical  Social Work [phone]

## 2013-03-18 NOTE — Progress Notes (Signed)
CSW reviewed note and agreeable with placement note. Pt faxed out 03/18/2013.  Jacklynn Lewis, MSW, LCSWA  Clinical Social Work [phone]

## 2013-03-18 NOTE — Care Management Note (Signed)
   CARE MANAGEMENT NOTE 03/18/2013  Patient:  Jack Barrett, Jack Barrett   Account Number:  [id number]  Date Initiated:  03/18/2013  Documentation initiated by:  Anjel Perfetti  Subjective/Objective Assessment:   67 yo male admitted with deconditioning and possible sepsis. PTA pt from home with [facility] services. Dr. Cyndie Chime following pt.     Action/Plan:   SNF   Anticipated DC Date:     Anticipated DC Plan:  SKILLED NURSING FACILITY      DC Planning Services  CM consult      Choice offered to / List presented to:  NA   DME arranged  NA      DME agency  NA     HH arranged  NA      HH agency  NA   Status of service:  Completed, signed off Medicare Important Message given?   (If response is "NO", the following Medicare IM given date fields will be blank) Date Medicare IM given:   Date Additional Medicare IM given:    Discharge Disposition:    Per UR Regulation:  Reviewed for med. necessity/level of care/duration of stay  If discussed at Long Length of Stay Meetings, dates discussed:    Comments:  03/18/13 Chart reviewed for utilization of services. Pt dispostion to SNF. CSW following. CM to sign off.
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
## 2013-03-18 NOTE — Progress Notes (Signed)
ANTIBIOTIC CONSULT NOTE - INITIAL  Pharmacy Consult for vancomycin and Zosyn Indication: rule out sepsis  Allergies  Allergen Reactions  . Aspirin Other (See Comments)    hemmoriage  . Codeine Other (See Comments)    Hallucinations; takes hydromorphone and morphine at home without issue  . Lactose Intolerance (Gi) Diarrhea    Patient Measurements: Height: 6\' 7"  (200.7 cm) Weight: 176 lb (79.833 kg) IBW/kg (Calculated) : 93.7   Vital Signs: Temp: 98.6 F (37 C) (10/08 1424) Temp src: Oral (10/08 1424) BP: 152/68 mmHg (10/08 1424) Pulse Rate: 64 (10/08 1424) Intake/Output from previous day: 10/07 0701 - 10/08 0700 In: 240 [P.O.:240] Out: -  Intake/Output from this shift: Total I/O In: 120 [P.O.:120] Out: -   Labs:  Recent Labs  03/17/13 1900 03/18/13 0344  WBC 7.6 8.3  HGB 12.9* 12.2*  PLT 161 171  CREATININE 1.22 1.18   Estimated Creatinine Clearance: 71.4 ml/min (by C-G formula based on Cr of 1.18).    Medical History: Past Medical History  Diagnosis Date  . Prostate cancer, primary, with metastasis from prostate to other site 01/05/2013  . HTN (hypertension), benign 01/15/2013  . COPD (chronic obstructive pulmonary disease) 01/15/2013  . IDDM (insulin dependent diabetes mellitus) 01/15/2013  . 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 Cancer associated pain 01/15/2013  . Status post right hip replacement 01/15/2013  . Chronic ulcer of gastric fundus 01/15/2013    EGD 11/24/12  . Depression, reactive 01/15/2013  . Decubitus ulcer     Microbiology: 9/28 blood x2: NGF 9/29 cath tip: MSSA and MRSA 9/29: drainage from port-a-cath site: MRSA 10/7 MRSA PCR: negative 10/8 blood x2: collected   Assessment: 60 yoM admitted 10/7 w functional decline, found by home health nurse in fetal position and in soiled undergarments PTA. Pt has a history of prostate cancer and is currently on Zytiga. Pt also has a recent history of MSSA and MRSA port-a-cath tip  infection with recent admission 9/28-10/2 where patient received IV vancomycin per pharmacy and the port-a-cath was removed on 9/30. Patient was then discharged on oral Bactrim to complete therapy.  Pharmacy has been consulted to dose vancomycin and Zosyn for suspected sepsis   Patient is afebrile  WBC wnl, 80% neutrophils on differential  SCr=1.18, baseline appears to be 0.9-1.2, UOP has not been accurately charted  Vancomycin trough above therapeutic range at 28.6 on 9/30 while patient was receiving 1g IV q8h  Will aim for higher vancomycin trough until sepsis is ruled out, can decreased trough goal at that time if indicated  Goal of Therapy:  Vancomycin trough level 15-20 mcg/ml eradication of infection  Plan:  -Zosyn 3.375g IV x1 run over 30 minutes -Zosyn 3.375g IV q8h, each dose run over 4 hours starting at midnight -vancomycin 1250mg  IV x1 now, to be given after Zosyn is administered -vancomycin 1g IV q12h starting at 0400 on 10/9 -vancomycin trough at steady state if indicated -follow-up culture results, clinical course, renal function -follow-up antibiotic de-escalation and length of therapy  Thank you for the consult.  Tomi Bamberger, PharmD Clinical Pharmacist Pager: [phone] Pharmacy: [phone] 03/18/2013 3:53 PM '

## 2013-03-18 NOTE — Evaluation (Signed)
Physical Therapy Evaluation Patient Details Name: RAMCES SHOMAKER MRN: [id number] DOB: [birth year] Today's Date: 03/18/2013 Time: 1415-1430 PT Time Calculation (min): 15 min  PT Assessment / Plan / Recommendation History of Present Illness  AMARI ZAGAL is an 67 y.o. male who was directly admitted on 03/17/2013 secondary to functional decline, diminished oral intake in the setting of prostate cancer and recent hospitalization 03/08/2013-03/12/2013 with a Port-A-Cath MRSA infection.  Clinical Impression  Pt is impaired in function by weakness, pain and nausea.  He needs 24 hour care and continued PT to increase functional mobility and  decrease burden of care    PT Assessment  Patient needs continued PT services    Follow Up Recommendations  SNF    Does the patient have the potential to tolerate intense rehabilitation      Barriers to Discharge        Equipment Recommendations  None recommended by PT    Recommendations for Other Services     Frequency Min 3X/week    Precautions / Restrictions Precautions Precautions: Fall   Pertinent Vitals/Pain Pain in low back from previous coccyx injury, neuropathy pain in legs      Mobility  Bed Mobility Bed Mobility: Supine to Sit Supine to Sit: HOB elevated;4: Min assist Sit to Supine: HOB elevated;4: Min guard Details for Bed Mobility Assistance: pt moved impulsively to edge of bed without waiting for safety Transfers Transfers: Sit to Stand;Stand to Sit Sit to Stand: From bed;From chair/3-in-1;3: Mod assist Stand to Sit: To bed;3: Mod assist;To chair/3-in-1 Details for Transfer Assistance: pt limited by pain in legs and back from previous coccyx injury from a fall Ambulation/Gait Ambulation/Gait Assistance: 3: Mod assist Ambulation Distance (Feet): 6 Feet Assistive device: Rolling walker Ambulation/Gait Assistance Details: needs assist for balance . Gait Pattern: Step-through pattern;Decreased step length - right;Decreased  step length - left;Decreased stride length;Trunk flexed Gait velocity: decreased General Gait Details: pt with generalized deconditioing, limited by pain in back, decreased sensation in legs from neuopathy, and nausea Stairs: No    Exercises     PT Diagnosis: Difficulty walking;Generalized weakness;Abnormality of gait;Acute pain  PT Problem List: Decreased strength;Decreased activity tolerance;Pain PT Treatment Interventions: DME instruction;Gait training;Stair training;Functional mobility training;Therapeutic activities;Therapeutic exercise;Patient/family education     PT Goals(Current goals can be found in the care plan section) Acute Rehab PT Goals Patient Stated Goal: to go somewhere will i will have help  Visit Information  Last PT Received On: 03/18/13 Assistance Needed: +1 History of Present Illness: CUAUHTEMOC HUEGEL is an 67 y.o. male who was directly admitted on 03/17/2013 secondary to functional decline, diminished oral intake in the setting of prostate cancer and recent hospitalization 03/08/2013-03/12/2013 with a Port-A-Cath MRSA infection.       Prior Functioning  Home Living Family/patient expects to be discharged to:: Skilled nursing facility Living Arrangements: Children Available Help at Discharge: Family;Available 24 hours/day Type of Home: Apartment Home Access: Stairs to enter Entrance Stairs-Number of Steps: 12 Entrance Stairs-Rails: Right;Left (pt reports he has help to go up and down steps) Home Layout: One level Home Equipment: Walker - 2 wheels Prior Function Level of Independence: Independent with assistive device(s) Communication Communication: No difficulties    Cognition  Cognition Arousal/Alertness: Awake/alert Behavior During Therapy: WFL for tasks assessed/performed Overall Cognitive Status: Within Functional Limits for tasks assessed    Extremity/Trunk Assessment Lower Extremity Assessment Lower Extremity Assessment: Generalized weakness;RLE  deficits/detail;LLE deficits/detail RLE Deficits / Details: generalized muscle atrophy RLE Sensation: history of peripheral neuropathy  LLE Deficits / Details: generalized muscle atrophy LLE Sensation: history of peripheral neuropathy Cervical / Trunk Assessment Cervical / Trunk Assessment: Other exceptions Cervical / Trunk Exceptions: pt reports pain in his back and tailbone from a fall he had   Balance    End of Session PT - End of Session Equipment Utilized During Treatment: Gait belt Activity Tolerance: Other (comment);Patient limited by pain (limited by nausea) Patient left: in chair Nurse Communication: Mobility status  GP    Bayard Hugger. [place], [place] [phone] 03/18/2013, 4:30 PM
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
## 2013-03-19 LAB — BASIC METABOLIC PANEL
BUN: 13 mg/dL (ref 6–23)
CO2: 24 mEq/L (ref 19–32)
Calcium: 8.7 mg/dL (ref 8.4–10.5)
Chloride: 101 mEq/L (ref 96–112)
Creatinine, Ser: 1.33 mg/dL (ref 0.50–1.35)
GFR calc Af Amer: 64 mL/min — ABNORMAL LOW (ref >90)
GFR calc non Af Amer: 55 mL/min — ABNORMAL LOW (ref >90)
Glucose, Bld: 117 mg/dL — ABNORMAL HIGH (ref 70–99)
Potassium: 4.5 mEq/L (ref 3.5–5.1)
Sodium: 135 mEq/L (ref 135–145)

## 2013-03-19 LAB — CBC
MCV: 84.8 fL (ref 78.0–100.0)
Platelets: 173 10*3/uL (ref 150–400)
RDW: 15.9 % — ABNORMAL HIGH (ref 11.5–15.5)
WBC: 7 10*3/uL (ref 4.0–10.5)

## 2013-03-19 LAB — GLUCOSE, CAPILLARY
Glucose-Capillary: 134 mg/dL — ABNORMAL HIGH (ref 70–99)
Glucose-Capillary: 168 mg/dL — ABNORMAL HIGH (ref 70–99)

## 2013-03-19 MED ORDER — AMLODIPINE BESYLATE 10 MG PO TABS
10.0000 mg | ORAL_TABLET | Freq: Every day | ORAL | Status: DC
  Administered 2013-03-19 – 2013-03-20 (×2): 10 mg via ORAL
  Filled 2013-03-19 (×2): qty 1

## 2013-03-19 MED ORDER — NICOTINE 14 MG/24HR TD PT24
14.0000 mg | MEDICATED_PATCH | Freq: Every day | TRANSDERMAL | Status: DC
  Administered 2013-03-19 – 2013-03-20 (×2): 14 mg via TRANSDERMAL
  Filled 2013-03-19 (×2): qty 1

## 2013-03-19 NOTE — Progress Notes (Addendum)
Occupational Therapy Treatment Patient Details Name: ERON STAAT MRN: [id number] DOB: [birth year] Today's Date: 03/19/2013 Time: 1020-1053 OT Time Calculation (min): 33 min  OT Assessment / Plan / Recommendation  History of present illness NATHANAEL KRIST is an 67 y.o. male who was directly admitted on 03/17/2013 secondary to functional decline, diminished oral intake in the setting of prostate cancer and recent hospitalization 03/08/2013-03/12/2013 with a Port-A-Cath MRSA infection.   OT comments  Pt limited by 9/10 back pain. Discussed AE options to help with LB dressing as pt states this was difficult PTA but pt not agreeable. States he has had AE in past and didn't find it helpful. Pt is impulsive and gets up without warning. Worked on safety with walker and standing balance to manage clothing.   Follow Up Recommendations  SNF;Supervision/Assistance - 24 hour    Barriers to Discharge       Equipment Recommendations  None recommended by OT    Recommendations for Other Services    Frequency Min 2X/week   Progress towards OT Goals Progress towards OT goals: Progressing toward goals  Plan Discharge plan remains appropriate    Precautions / Restrictions Precautions Precautions: Fall Restrictions Weight Bearing Restrictions: No   Pertinent Vitals/Pain 9/10 back pain; nursing aware.    ADL  Lower Body Dressing: Performed;Minimal assistance Where Assessed - Lower Body Dressing: Supported sit to stand Toilet Transfer: Simulated;Min Engineer, manufacturing Method: Stand pivot (pt unsafe with RW) Equipment Used: Rolling walker ADL Comments: Pt unsafe with RW and only holds to one handle and holds to the center of the walker with the other hand and steps around to chair very forward flexed posture. Pt stood from chair to change depends without warning that he was going to stand up. He also stepped out of depends while standing despite cues to sit down and doff them. He did agree with  encouragement to sit and start depends over feet before standing to pull up. DIscussed safety and recommended pt don pants over feet first in a seated position and then stand to pull up. Pt verbalized understanding. Pt limited by 9/10 back pain. Nursing made aware.  No chair alarm initially available but able to obtain one. When OT requested pt stand up to have chair alarm placed, he states, "I will just get back into bed." Explained purpose of chair alarm but pt seemed alittle frustrated and still wanting to return to bed. Assisted pt back to bed and IV team then came to address IV site. Bed alarm activated. Min assist to stand from chair and step around to bed but still unsafe with RW use.   OT Diagnosis:    OT Problem List:   OT Treatment Interventions:     OT Goals(current goals can now be found in the care plan section) Acute Rehab OT Goals Patient Stated Goal: to go somewhere will i will have help  Visit Information  Last OT Received On: 03/19/13 Assistance Needed: +1 History of Present Illness: NESANEL AGUILA is an 67 y.o. male who was directly admitted on 03/17/2013 secondary to functional decline, diminished oral intake in the setting of prostate cancer and recent hospitalization 03/08/2013-03/12/2013 with a Port-A-Cath MRSA infection.    Subjective Data      Prior Functioning       Cognition  Cognition Arousal/Alertness: Awake/alert Behavior During Therapy: Impulsive Overall Cognitive Status: Within Functional Limits for tasks assessed    Mobility  Bed Mobility Bed Mobility: Supine to Sit Supine to Sit:  4: Min guard;HOB elevated Details for Bed Mobility Assistance: pt is impulsive and moves without warning to therapist Transfers Transfers: Sit to Stand;Stand to Sit Sit to Stand: 4: Min assist;With upper extremity assist;From bed;From chair/3-in-1 Stand to Sit: 4: Min assist;To chair/3-in-1 Details for Transfer Assistance: pt stood without walker in front of him and is  impulsive. Pt limited by back pain.    Exercises      Balance     End of Session OT - End of Session Equipment Utilized During Treatment: Gait belt;Rolling walker Activity Tolerance: Patient limited by pain  GO     Lennox Laity [phone] 03/19/2013, 11:33 AM

## 2013-03-19 NOTE — Progress Notes (Signed)
No acute change. He is very bright and interactive today. He states his pain control is good. He is eating better after being medicated for nausea. Exam is stable. Wound site dry right subclavicular area. Small amount of packing still in the wound. PSA 15 unchanged from baseline prior to starting Zytiga in August but at least not higher and we may need more time a drug to see maximum benefit. He is tolerating the Zytiga and I would continue it for now. Arrangements are in progress to find a suitable extended care facility for him. Patient's daughter to meet with our social worker this morning. Both physical and occupational therapy have consulted and feel he needs continued services. He was put on parenteral vancomycin and Zosyn pending followup cultures. He has been afebrile since admission. He has a small chest wound which is healing nicely. Impression: #1. Deconditioning due to recent hospitalization, infection, and advanced prostate cancer.  #2. Recent superficial Port-A-Cath infection status post removal of catheter.  #3. Low grade MRSA infection of Port-A-Cath treated with parenteral antibiotics while hospitalized recently. Wound now healing nicely. No concerns for ongoing infection.  #4. Prostate cancer metastatic to bone.  Recent trial of Zytiga too early to assess results. PSA unchanged but at least not higher since starting this drug in August. #5. Pain issues related to #4. He continues on narcotic analgesics around the clock. Good pain control at present #6. Insulin-dependent diabetes  #7. Essential hypertension  Plan: Per hospital medicine service He has followup in our office on October 15.

## 2013-03-19 NOTE — Progress Notes (Addendum)
CSW met with pt at bedside to discuss SNF bed offers.  Pt daughter not yet present at bedside.  Pt was unfamiliar with facilities that had offered a bed and felt that it would be best for CSW to leave phone number and pt daughter to notify CSW when she arrived in order to further discuss bed offers.  CSW left bed offers at pt bedside and provided contact phone number for this CSW.  CSW to follow up with pt and pt daughter re: SNF placement.  CSW to continue to follow.  Addendum 2:52pm:  CSW visited pt room again. Pt sleeping and no family present at bedside.  CSW had not yet received phone call from pt daughter re: SNF placement.   CSW contacted pt daughter, Nicolette via telephone and provided SNF bed offer via telephone.   Pt daughter plans to review the bed offers and speak with pt regarding bed offers.   CSW to follow up re: decision for SNF.  CSW to continue to follow and facilitate pt discharge needs when pt medically ready for discharge.   Jacklynn Lewis, MSW, LCSWA  Clinical Social Work [phone]
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
## 2013-03-20 ENCOUNTER — Non-Acute Institutional Stay (SKILLED_NURSING_FACILITY): Payer: Medicare Other | Admitting: Internal Medicine

## 2013-03-20 DIAGNOSIS — R7881 Bacteremia: Secondary | ICD-10-CM

## 2013-03-20 DIAGNOSIS — C801 Malignant (primary) neoplasm, unspecified: Secondary | ICD-10-CM

## 2013-03-20 DIAGNOSIS — IMO0001 Reserved for inherently not codable concepts without codable children: Secondary | ICD-10-CM

## 2013-03-20 DIAGNOSIS — E119 Type 2 diabetes mellitus without complications: Secondary | ICD-10-CM

## 2013-03-20 DIAGNOSIS — J449 Chronic obstructive pulmonary disease, unspecified: Secondary | ICD-10-CM

## 2013-03-20 DIAGNOSIS — Z794 Long term (current) use of insulin: Secondary | ICD-10-CM

## 2013-03-20 DIAGNOSIS — E43 Unspecified severe protein-calorie malnutrition: Secondary | ICD-10-CM

## 2013-03-20 DIAGNOSIS — C61 Malignant neoplasm of prostate: Secondary | ICD-10-CM

## 2013-03-20 DIAGNOSIS — I10 Essential (primary) hypertension: Secondary | ICD-10-CM

## 2013-03-20 LAB — GLUCOSE, CAPILLARY
Glucose-Capillary: 177 mg/dL — ABNORMAL HIGH (ref 70–99)
Glucose-Capillary: 132 mg/dL — ABNORMAL HIGH (ref 70–99)

## 2013-03-20 MED ORDER — HYDRALAZINE HCL 25 MG PO TABS
25.0000 mg | ORAL_TABLET | Freq: Three times a day (TID) | ORAL | Status: DC
  Administered 2013-03-20: 25 mg via ORAL
  Filled 2013-03-20 (×3): qty 1

## 2013-03-20 MED ORDER — HYDRALAZINE HCL 25 MG PO TABS: 25.0000 mg | ORAL_TABLET | Freq: Three times a day (TID) | ORAL | Status: DC

## 2013-03-20 MED ORDER — POLYETHYLENE GLYCOL 3350 17 G PO PACK: 17.0000 g | PACK | Freq: Every day | ORAL | Status: DC | PRN

## 2013-03-20 MED ORDER — SULFAMETHOXAZOLE-TMP DS 800-160 MG PO TABS
1.0000 | ORAL_TABLET | Freq: Two times a day (BID) | ORAL | Status: DC
  Administered 2013-03-20: 1 via ORAL
  Filled 2013-03-20 (×2): qty 1

## 2013-03-20 MED ORDER — NICOTINE 14 MG/24HR TD PT24: 1.0000 | MEDICATED_PATCH | Freq: Every day | TRANSDERMAL | Status: DC

## 2013-03-20 MED ORDER — ACETAMINOPHEN 325 MG PO TABS: 650.0000 mg | ORAL_TABLET | Freq: Four times a day (QID) | ORAL | Status: DC | PRN

## 2013-03-20 MED ORDER — HYDRALAZINE HCL 25 MG PO TABS
25.0000 mg | ORAL_TABLET | Freq: Once | ORAL | Status: AC
  Administered 2013-03-20: 25 mg via ORAL
  Filled 2013-03-20: qty 1

## 2013-03-20 MED ORDER — INSULIN ASPART 100 UNIT/ML ~~LOC~~ SOLN: 0.0000 [IU] | Freq: Three times a day (TID) | SUBCUTANEOUS | Status: DC

## 2013-03-20 MED ORDER — HYDROMORPHONE HCL 8 MG PO TABS: 8.0000 mg | ORAL_TABLET | ORAL | Status: DC | PRN

## 2013-03-20 MED ORDER — MORPHINE SULFATE ER 60 MG PO TBCR: 60.0000 mg | EXTENDED_RELEASE_TABLET | Freq: Three times a day (TID) | ORAL | Status: DC

## 2013-03-20 MED ORDER — AMLODIPINE BESYLATE 10 MG PO TABS: 10.0000 mg | ORAL_TABLET | Freq: Every day | ORAL | Status: DC

## 2013-03-20 MED ORDER — LISINOPRIL 20 MG PO TABS: 20.0000 mg | ORAL_TABLET | Freq: Every day | ORAL | Status: DC

## 2013-03-20 MED ORDER — INSULIN ASPART 100 UNIT/ML ~~LOC~~ SOLN: 0.0000 [IU] | Freq: Every day | SUBCUTANEOUS | Status: DC

## 2013-03-20 NOTE — Progress Notes (Signed)
Spoke with patient's daughter Nicollette today at her request.  She was concerned that her father did not have a catheter and his continued urinary incontinence would delay wound healing of his sacral decubiti.  We spoke of using a condom catheter as opposed to an indwelling.  He had success during his last hospital stay with a condom catheter.  Dr. Darnelle Catalan is in agreement with this if the patient will comply. Nicollette also wanted to inform our staff that using alprazolam when her father displays anxiety, stubbornness has been very effective.  I shared all of this information with Mr. [facility] nurse Rene Kocher.

## 2013-03-20 NOTE — Progress Notes (Signed)
Physical Therapy Treatment Patient Details Name: Jack Barrett MRN: [id number] DOB: June 21, 1945 Today's Date: 03/20/2013 Time: 1050-1120 PT Time Calculation (min): 30 min  PT Assessment / Plan / Recommendation  History of Present Illness Pt continues to c/o LE and perinuem pain and numbness   PT Comments   Pt motivated to improve and did well with exercises in sitting though he was limited by decreased sensation especially with RLE. He has more diffuclty with gait and needs 2 people for safety. He needs continued PT 5x/week to increase functional mobility.   Follow Up Recommendations  SNF     Does the patient have the potential to tolerate intense rehabilitation     Barriers to Discharge        Equipment Recommendations       Recommendations for Other Services    Frequency     Progress towards PT Goals Progress towards PT goals: Progressing toward goals  Plan Current plan remains appropriate    Precautions / Restrictions Precautions Precautions: Fall Restrictions Weight Bearing Restrictions: No   Pertinent Vitals/Pain Pt with pain and numbness in lower body    Mobility  Bed Mobility Bed Mobility: Sit to Supine Supine to Sit: 4: Min guard;HOB elevated Sit to Supine: 4: Min guard;HOB flat Details for Bed Mobility Assistance: Pt limited by pain in lower body and wants to lie down after activity to achieve comfort Transfers Transfers: Sit to Stand;Stand to Sit Sit to Stand: 4: Min assist;With upper extremity assist;From chair/3-in-1 Stand to Sit: 4: Min assist;To chair/3-in-1 Details for Transfer Assistance: Worked on repeated sit to stand for strengthening.  Pt lmited by pain and decreased sensation.  He has some dyspnea with effort.  Ambulation/Gait Ambulation/Gait Assistance: 3: Mod assist Ambulation Distance (Feet): 25 Feet Assistive device: Rolling walker Ambulation/Gait Assistance Details: Pt needs mod assist for balance and to direct RW.  Gait Pattern:  Step-through pattern;Decreased step length - right;Decreased step length - left;Decreased stride length;Trunk flexed;Ataxic Gait velocity: decreased General Gait Details: Gait is impaired by gait and inablity to stand erect or move in a controlled manner due to pain. He needs +2 for safety as he moves uncontollably and without warning to therapist Stairs: No Wheelchair Mobility Wheelchair Mobility: No    Exercises General Exercises - Lower Extremity Ankle Circles/Pumps: AROM;Both;5 reps;Seated Quad Sets: AROM;Both;5 reps;Seated Gluteal Sets: AROM;Both;5 reps;Standing Long Arc Quad: AAROM;Both;10 reps;Seated Hip ABduction/ADduction: Strengthening;Both;10 reps;Seated (isometric) Hip Flexion/Marching: AROM;Both;10 reps;Seated Toe Raises: AROM;Both;5 reps;Seated Heel Raises: AROM;Both;5 reps;Seated   PT Diagnosis:    PT Problem List:   PT Treatment Interventions:     PT Goals (current goals can now be found in the care plan section)    Visit Information  Last PT Received On: 03/20/13 Assistance Needed: +2 History of Present Illness: Pt continues to c/o LE and perinuem pain and numbness    Subjective Data      Cognition  Cognition Arousal/Alertness: Awake/alert Behavior During Therapy: Impulsive;WFL for tasks assessed/performed Overall Cognitive Status: Within Functional Limits for tasks assessed    Balance  Balance Balance Assessed: Yes Static Sitting Balance Static Sitting - Balance Support: No upper extremity supported;Feet supported Static Sitting - Level of Assistance: 7: Independent Static Standing Balance Static Standing - Balance Support: Bilateral upper extremity supported Static Standing - Level of Assistance: 5: Stand by assistance Static Standing - Comment/# of Minutes: Pt only able to stand for 45 sec. with effort and dyspnea noted  End of Session PT - End of Session Activity Tolerance: Patient  limited by pain;Patient limited by fatigue Patient left: in  bed;with bed alarm set Nurse Communication: Mobility status   GP    Bayard Hugger. Manson Passey, PT [phone] 03/20/2013, 11:31 AM
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
## 2013-03-20 NOTE — Progress Notes (Signed)
Pt discharged to [facility].  Pt's daughters were arguing over how he should leave.  One sister wanted him to leave via EMS the other wanted to drive him.  Social Work was called, and EMS was arranged.  The pt and one daughter became upset and kept requesting to leave.  The daughter with POA decided to let the pt leave with his other daughter.  Reviewed discharge paperwork.  Packet from Child psychotherapist was given to patient and his daughter.  Report was called to Clydie Braun at [facility].

## 2013-03-20 NOTE — Progress Notes (Signed)
Patient is set to discharge to [facility] SNF today. Patient & daughters, Angus Palms & Nicolette aware. Discharge packet in [place]. Daughter, Angus Palms to transport patient to [facility].   Clinical Social Work Department CLINICAL SOCIAL WORK PLACEMENT NOTE 03/20/2013  Patient:  CHRISTYAN, REGER  Account Number:  [id number] Admit date:  03/17/2013  Clinical Social Worker:  Jacelyn Grip  Date/time:  03/18/2013 01:34 PM  Clinical Social Work is seeking post-discharge placement for this patient at the following level of care:   SKILLED NURSING   (*CSW will update this form in Epic as items are completed)   03/18/2013  Patient/family provided with [facility] Department of Clinical Social Work's list of facilities offering this level of care within the geographic area requested by the patient (or if unable, by the patient's family).  03/18/2013  Patient/family informed of their freedom to choose among providers that offer the needed level of care, that participate in Medicare, Medicaid or managed care program needed by the patient, have an available bed and are willing to accept the patient.  03/18/2013  Patient/family informed of [facility]' ownership interest in [facility], as well as of the fact that they are under no obligation to receive care at this facility.  PASARR submitted to [employer] on 03/18/2013 PASARR number received from [employer] on 03/18/2013  FL2 transmitted to all facilities in geographic area requested by pt/family on  03/18/2013 FL2 transmitted to all facilities within larger geographic area on   Patient informed that his/her managed care company has contracts with or will negotiate with  certain facilities, including the following:     Patient/family informed of bed offers received:  03/20/2013 Patient chooses bed at [facility] Physician recommends and patient chooses bed at    Patient to be transferred to [facility] on   03/20/2013 Patient to be transferred to facility by daughter, Kai's car  The following physician request were entered in Epic:   Additional Comments:   Unice Bailey, LCSW [facility] Clinical Social Worker cell #: [phone]
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
## 2013-03-20 NOTE — Progress Notes (Signed)
CSW spoke with patient's daughter, Nicolette at bedside - [facility] to transport to [facility] called for transport.   *Note: Cruzito Standre [phone] is POA (copy has been scanned in & able to be pulled up under Careers information officer in Epic)  Unice Bailey, LCSW [facility] Clinical Social Worker cell #: [phone]

## 2013-03-24 ENCOUNTER — Encounter: Payer: Self-pay | Admitting: Internal Medicine

## 2013-03-24 LAB — CULTURE, BLOOD (ROUTINE X 2)
Culture: NO GROWTH
Culture: NO GROWTH

## 2013-03-24 NOTE — Assessment & Plan Note (Signed)
Hydralazine is new drug for pt that was started just prior to admission;pt on noevasc 10;cant be on Lisinopril because of kidney fx;will need to watch pressures

## 2013-03-24 NOTE — Assessment & Plan Note (Signed)
Will start nutrition supplements here.

## 2013-03-24 NOTE — Assessment & Plan Note (Signed)
Stable

## 2013-03-24 NOTE — Progress Notes (Signed)
MRN: [id number] Name: Jack Barrett  Sex: male Age: 67 y.o. DOB: Oct 08, 1945  PSC #: Ronni Rumble Facility/Room: 132A Level Of Care: SNF Provider: Merrilee Seashore D Emergency Contacts: Extended Emergency Contact Information Primary Emergency Contact: Miklas,Nicolette  [place] Mobile Phone: [phone] Relation: Daughter Secondary Emergency Contact: Vernell Bar[place] Home Phone: [phone] Relation: Daughter  Code Status:   Allergies: Aspirin; Codeine; and Lactose intolerance (gi)  Chief Complaint  Patient presents with  . nursing home admission    HPI: Patient is 67 y.o. male who is being transferred from another SNF having just been hosp for presumed MRSA bacteremia and prostate CA metastatic to bone.  Past Medical History  Diagnosis Date  . HTN (hypertension), benign 01/15/2013  . COPD (chronic obstructive pulmonary disease) 01/15/2013  . IDDM (insulin dependent diabetes mellitus) 01/15/2013  . 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 Cancer associated pain 01/15/2013  . Status post right hip replacement 01/15/2013  . Chronic ulcer of gastric fundus 01/15/2013    EGD 11/24/12  . Depression, reactive 01/15/2013  . Decubitus ulcer   . Prostate cancer, primary, with metastasis from prostate to other site 01/05/2013    Past Surgical History  Procedure Laterality Date  . Total hip arthroplasty    . Prostate surgery    . Porta cath    . Skin grafts    . Port-a-cath removal Right 03/09/2013    Procedure: REMOVAL PORT-A-CATH;  Surgeon: Robyne Askew, MD;  Location: [facility];  Service: General;  Laterality: Right;      Medication List    Notice   This visit is during an admission. Changes to the med list made in this visit will be reflected in the After Visit Summary of the admission.      Meds ordered this encounter  Medications  . ALPRAZolam (XANAX) 1 MG tablet    Sig: Take 1 mg by mouth 2 (two) times daily as needed for  sleep or anxiety. And qHs scheduled  . insulin aspart (NOVOLOG) 100 UNIT/ML injection    Sig: Inject 5 Units into the skin 3 (three) times daily with meals.  . insulin aspart (NOVOLOG) 100 UNIT/ML injection    Sig: Inject 5 Units into the skin at bedtime.    There is no immunization history for the selected administration types on file for this patient.  History  Substance Use Topics  . Smoking status: Current Every Day Smoker -- 1.00 packs/day    Types: Cigarettes  . Smokeless tobacco: Never Used  . Alcohol Use: No    Family history is noncontributory    Review of Systems  DATA OBTAINED: from patient;PT HAS NO C/O BUT HE IS GOING TO THINK ABOUT WHETHER HE NEED MORE PAIN MEDICATION GENERAL: Feels well no fevers, fatigue, appetite changes SKIN: No itching, rash or wounds EYES: No eye pain, redness, discharge EARS: No earache, tinnitus, change in hearing NOSE: No congestion, drainage or bleeding  MOUTH/THROAT: No mouth or tooth pain, No sore throat, No difficulty chewing or swallowing  RESPIRATORY: No cough, wheezing, SOB CARDIAC: No chest pain, palpitations, lower extremity edema  GI: No abdominal pain, No N/V/D or constipation, No heartburn or reflux  GU: No dysuria, frequency or urgency, or incontinence  MUSCULOSKELETAL: MOSTLY RELIEVED bone/joint pain NEUROLOGIC: No headache, dizziness or focal weakness PSYCHIATRIC: No overt anxiety or sadness. ADMITS SOMETIMES PAIN WAKES HIM UP. No behavior issue.   Filed Vitals:   03/24/13 1324  BP: 132/77  Pulse: 84    Physical Exam  GENERAL APPEARANCE: Alert, conversant. Appropriately groomed. No acute distress.  SKIN: No diaphoresis rash, or wounds HEAD: Normocephalic, atraumatic  EYES: Conjunctiva/lids clear. Pupils round, reactive. EOMs intact.  EARS: External exam WNL, canals clear. Hearing grossly normal.  NOSE: No deformity or discharge.  MOUTH/THROAT: Lips w/o lesions. Mouth and throat normal. Tongue moist, w/o lesion.   NECK: No thyroid tenderness, enlargement or nodule  RESPIRATORY: Breathing is even, unlabored. Lung sounds are clear   CARDIOVASCULAR: Heart RRR no murmurs, rubs or gallops. No peripheral edema.  GASTROINTESTINAL: Abdomen is soft, non-tender, not distended w/ normal bowel sounds GENITOURINARY: Bladder non tender, not distended  MUSCULOSKELETAL: No abnormal joints or musculature NEUROLOGIC: Oriented X3. Cranial nerves 2-12 grossly intact. Moves all extremities no tremor. PSYCHIATRIC: Mood and affect appropriate to situation, no behavioral issues  Patient Active Problem List   Diagnosis Date Noted  . Acute confusional state 03/18/2013  . Unintentional weight loss 03/18/2013  . Severe protein-calorie malnutrition in the context of chronic illness 03/18/2013  . Diaphoresis 03/18/2013  . Bacteremia 03/18/2013  . Infection due to portacath 03/08/2013  . Adult failure to thrive 03/08/2013  . Infection due to port-a-cath 03/08/2013  . HTN (hypertension), benign 01/15/2013  . COPD (chronic obstructive pulmonary disease) 01/15/2013  . IDDM (insulin dependent diabetes mellitus) 01/15/2013  . 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 Cancer associated pain 01/15/2013  . Status post right hip replacement 01/15/2013  . Chronic ulcer of gastric fundus 01/15/2013  . Depression, reactive 01/15/2013  . Prostate cancer, primary, with metastasis from prostate to other site 01/05/2013    CBC    Component Value Date/Time   WBC 7.0 03/19/2013 0337   WBC 8.0 02/04/2013 1526   RBC 4.29 03/19/2013 0337   RBC 4.20 02/04/2013 1526   HGB 12.1* 03/19/2013 0337   HGB 11.7* 02/04/2013 1526   HCT 36.4* 03/19/2013 0337   HCT 37.0* 02/04/2013 1526   PLT 173 03/19/2013 0337   PLT 142 02/04/2013 1526   MCV 84.8 03/19/2013 0337   MCV 88.1 02/04/2013 1526   LYMPHSABS 0.9 03/17/2013 1900   LYMPHSABS 1.2 02/04/2013 1526   MONOABS 0.6 03/17/2013 1900   MONOABS 0.5 02/04/2013 1526   EOSABS 0.0 03/17/2013 1900    EOSABS 0.1 02/04/2013 1526   BASOSABS 0.0 03/17/2013 1900   BASOSABS 0.0 02/04/2013 1526    CMP     Component Value Date/Time   NA 135 03/19/2013 0337   NA 142 02/04/2013 1526   K 4.5 03/19/2013 0337   K 4.0 02/04/2013 1526   CL 101 03/19/2013 0337   CO2 24 03/19/2013 0337   CO2 22 02/04/2013 1526   GLUCOSE 117* 03/19/2013 0337   GLUCOSE 152* 02/04/2013 1526   BUN 13 03/19/2013 0337   BUN 12.7 02/04/2013 1526   CREATININE 1.33 03/19/2013 0337   CREATININE 1.1 02/04/2013 1526   CALCIUM 8.7 03/19/2013 0337   CALCIUM 8.4 02/04/2013 1526   PROT 6.9 03/17/2013 1900   PROT 6.2* 02/04/2013 1526   ALBUMIN 2.9* 03/17/2013 1900   ALBUMIN 2.8* 02/04/2013 1526   AST 12 03/17/2013 1900   AST 8 02/04/2013 1526   ALT 7 03/17/2013 1900   ALT 8 02/04/2013 1526   ALKPHOS 120* 03/17/2013 1900   ALKPHOS 93 02/04/2013 1526   BILITOT 0.3 03/17/2013 1900   BILITOT 0.34 02/04/2013 1526   GFRNONAA 55* 03/19/2013 0337   GFRAA 64* 03/19/2013 0337    Assessment and  Plan  Bacteremia Suspected on hospitalization 10/7-03/2013 based on the fact that he had a MRSA Port a cath infection 9/28-10/07/2012;was felt to have had transient bacteremia as a cause of his decline so was treated empirically for MRSA with IV Vanc and Zosyn, then changed to septra for 10 days starting 10/10.  Severe protein-calorie malnutrition in the context of chronic illness Will start nutrition supplements here.  Prostate cancer, primary, with metastasis from prostate to other site Pt on Zytiga and supposed to be on prednisone but isn't so will restart that Prednisone 5 mg BID; PT HAS SCHEDULED MS CONTIN AND PRN DILAUDID BUT I AM GOING TO SCHEDULE ONE DILAUDID TAB AT HS  HTN (hypertension), benign Hydralazine is new drug for pt that was started just prior to admission;pt on noevasc 10;cant be on Lisinopril because of kidney fx;will need to watch pressures  COPD (chronic obstructive pulmonary disease) Stable  IDDM (insulin dependent diabetes  mellitus) Last HbA1c was 6.6%; CBG's 88-168 prior to arrival to here; CBG's are being checked 4 times a day    Margit Hanks, MD

## 2013-03-24 NOTE — Assessment & Plan Note (Signed)
Last HbA1c was 6.6%; CBG's 88-168 prior to arrival to here; CBG's are being checked 4 times a day

## 2013-03-24 NOTE — Assessment & Plan Note (Addendum)
Pt on Zytiga and supposed to be on prednisone but isn't so will restart that Prednisone 5 mg BID; PT HAS SCHEDULED MS CONTIN AND PRN DILAUDID BUT I AM GOING TO SCHEDULE ONE DILAUDID TAB AT HS

## 2013-03-24 NOTE — Assessment & Plan Note (Signed)
Suspected on hospitalization 10/7-03/2013 based on the fact that he had a MRSA Port a cath infection 9/28-10/07/2012;was felt to have had transient bacteremia as a cause of his decline so was treated empirically for MRSA with IV Vanc and Zosyn, then changed to septra for 10 days starting 10/10.

## 2013-03-25 ENCOUNTER — Ambulatory Visit (HOSPITAL_BASED_OUTPATIENT_CLINIC_OR_DEPARTMENT_OTHER): Payer: Medicare Other | Admitting: Nurse Practitioner

## 2013-03-25 ENCOUNTER — Telehealth: Payer: Self-pay | Admitting: Oncology

## 2013-03-25 ENCOUNTER — Other Ambulatory Visit (HOSPITAL_BASED_OUTPATIENT_CLINIC_OR_DEPARTMENT_OTHER): Payer: Medicare Other | Admitting: Lab

## 2013-03-25 VITALS — BP 171/81 | HR 90 | Temp 97.5°F | Resp 20 | Ht 77.0 in | Wt 175.5 lb

## 2013-03-25 DIAGNOSIS — C61 Malignant neoplasm of prostate: Secondary | ICD-10-CM

## 2013-03-25 DIAGNOSIS — C7951 Secondary malignant neoplasm of bone: Secondary | ICD-10-CM

## 2013-03-25 DIAGNOSIS — N189 Chronic kidney disease, unspecified: Secondary | ICD-10-CM

## 2013-03-25 DIAGNOSIS — E1143 Type 2 diabetes mellitus with diabetic autonomic (poly)neuropathy: Secondary | ICD-10-CM

## 2013-03-25 DIAGNOSIS — J449 Chronic obstructive pulmonary disease, unspecified: Secondary | ICD-10-CM

## 2013-03-25 DIAGNOSIS — E119 Type 2 diabetes mellitus without complications: Secondary | ICD-10-CM

## 2013-03-25 DIAGNOSIS — G893 Neoplasm related pain (acute) (chronic): Secondary | ICD-10-CM

## 2013-03-25 DIAGNOSIS — I10 Essential (primary) hypertension: Secondary | ICD-10-CM

## 2013-03-25 LAB — CBC WITH DIFFERENTIAL/PLATELET
Basophils Absolute: 0.1 10*3/uL (ref 0.0–0.1)
Eosinophils Absolute: 0.2 10*3/uL (ref 0.0–0.5)
HGB: 13.9 g/dL (ref 13.0–17.1)
LYMPH%: 20.3 % (ref 14.0–49.0)
MCHC: 33.2 g/dL (ref 32.0–36.0)
MCV: 86.1 fL (ref 79.3–98.0)
MONO#: 0.7 10*3/uL (ref 0.1–0.9)
MONO%: 9.6 % (ref 0.0–14.0)
NEUT#: 4.5 10*3/uL (ref 1.5–6.5)
Platelets: 186 10*3/uL (ref 140–400)
RDW: 15.8 % — ABNORMAL HIGH (ref 11.0–14.6)

## 2013-03-25 LAB — COMPREHENSIVE METABOLIC PANEL (CC13)
AST: 13 U/L (ref 5–34)
Albumin: 3.2 g/dL — ABNORMAL LOW (ref 3.5–5.0)
Alkaline Phosphatase: 123 U/L (ref 40–150)
Anion Gap: 10 mEq/L (ref 3–11)
BUN: 15 mg/dL (ref 7.0–26.0)
CO2: 21 mEq/L — ABNORMAL LOW (ref 22–29)
Glucose: 143 mg/dl — ABNORMAL HIGH (ref 70–140)
Potassium: 4.1 mEq/L (ref 3.5–5.1)
Total Bilirubin: 0.41 mg/dL (ref 0.20–1.20)

## 2013-03-25 LAB — LACTATE DEHYDROGENASE (CC13): LDH: 230 U/L (ref 125–245)

## 2013-03-25 LAB — PSA: PSA: 15.42 ng/mL — ABNORMAL HIGH (ref <=4.00)

## 2013-03-25 NOTE — Telephone Encounter (Signed)
gv and printed appt sched and avs for pt for NOV...gv pt infor for labaur
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
## 2013-04-07 ENCOUNTER — Non-Acute Institutional Stay (SKILLED_NURSING_FACILITY): Payer: Medicare Other | Admitting: Internal Medicine

## 2013-04-07 ENCOUNTER — Encounter: Payer: Self-pay | Admitting: Internal Medicine

## 2013-04-07 DIAGNOSIS — C801 Malignant (primary) neoplasm, unspecified: Secondary | ICD-10-CM

## 2013-04-07 DIAGNOSIS — C61 Malignant neoplasm of prostate: Secondary | ICD-10-CM

## 2013-04-07 DIAGNOSIS — G893 Neoplasm related pain (acute) (chronic): Secondary | ICD-10-CM

## 2013-04-07 DIAGNOSIS — I10 Essential (primary) hypertension: Secondary | ICD-10-CM

## 2013-04-07 DIAGNOSIS — L8993 Pressure ulcer of unspecified site, stage 3: Secondary | ICD-10-CM

## 2013-04-07 DIAGNOSIS — J449 Chronic obstructive pulmonary disease, unspecified: Secondary | ICD-10-CM

## 2013-04-07 DIAGNOSIS — L8990 Pressure ulcer of unspecified site, unspecified stage: Secondary | ICD-10-CM

## 2013-04-07 DIAGNOSIS — Z794 Long term (current) use of insulin: Secondary | ICD-10-CM

## 2013-04-07 DIAGNOSIS — E119 Type 2 diabetes mellitus without complications: Secondary | ICD-10-CM

## 2013-04-07 NOTE — Progress Notes (Signed)
MRN: [id number] Name: Jack Barrett  Sex: male Age: 67 y.o. DOB: 1945/11/07  PSC #: Ronni Rumble Facility/Room: Level Of Care: SNF Provider: Merrilee Seashore D Emergency Contacts: Extended Emergency Contact Information Primary Emergency Contact: Wease,Nicolette  [place] Mobile Phone: [phone] Relation: Daughter Secondary Emergency Contact: Vernell Barrier [place] Home Phone: [phone] Relation: Daughter  Code Status: FULL  Allergies: Aspirin; Codeine; and Lactose intolerance (gi)  Chief Complaint  Patient presents with  . Discharge Note    HPI: Patient is 67 y.o. male with metastatic prostate CA who is being d/c to home.  Past Medical History  Diagnosis Date  . HTN (hypertension), benign 01/15/2013  . COPD (chronic obstructive pulmonary disease) 01/15/2013  . IDDM (insulin dependent diabetes mellitus) 01/15/2013  . 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 Cancer associated pain 01/15/2013  . Status post right hip replacement 01/15/2013  . Chronic ulcer of gastric fundus 01/15/2013    EGD 11/24/12  . Depression, reactive 01/15/2013  . Decubitus ulcer   . Prostate cancer, primary, with metastasis from prostate to other site 01/05/2013    Past Surgical History  Procedure Laterality Date  . Total hip arthroplasty    . Prostate surgery    . Porta cath    . Skin grafts    . Port-a-cath removal Right 03/09/2013    Procedure: REMOVAL PORT-A-CATH;  Surgeon: Robyne Askew, MD;  Location: [facility];  Service: General;  Laterality: Right;      Medication List       This list is accurate as of: 04/07/13  3:57 PM.  Always use your most recent med list.               abiraterone Acetate 250 MG tablet  Commonly known as:  ZYTIGA  Take 4 tablets (1,000 mg total) by mouth daily. Take on an empty stomach 1 hour before or 2 hours after a meal     ALPRAZolam 1 MG tablet  Commonly known as:  XANAX  Take 1 mg by mouth 2 (two) times daily as  needed for sleep or anxiety. And qHs scheduled     amLODipine 10 MG tablet  Commonly known as:  NORVASC  Take 1 tablet (10 mg total) by mouth daily.     ammonium lactate 12 % cream  Commonly known as:  AMLACTIN  Apply 1 g topically 2 (two) times daily.     calcium carbonate 600 MG Tabs tablet  Commonly known as:  OS-CAL  Take 600 mg by mouth daily with breakfast.     cholecalciferol 1000 UNITS tablet  Commonly known as:  VITAMIN D  Take 1,000 Units by mouth daily.     DECUBI-VITE PO  Take 2 tablets by mouth daily.     hydrALAZINE 25 MG tablet  Commonly known as:  APRESOLINE  Take 1 tablet (25 mg total) by mouth every 8 (eight) hours.     HYDROGEL Gel  1 application by Does not apply route daily. Apply to right chest     HYDROmorphone 8 MG tablet  Commonly known as:  DILAUDID  Take 1 tablet (8 mg total) by mouth every 4 (four) hours as needed for pain.     insulin aspart 100 UNIT/ML injection  Commonly known as:  novoLOG  Inject 5 Units into the skin 3 (three) times daily with meals.     morphine 60 MG 12 hr tablet  Commonly known as:  MS CONTIN  Take 1 tablet (60 mg total) by mouth 3 (three) times daily after meals.     nicotine 14 mg/24hr patch  Commonly known as:  NICODERM CQ - dosed in mg/24 hours  Place 1 patch onto the skin daily.     ondansetron 8 MG disintegrating tablet  Commonly known as:  ZOFRAN ODT  Take 1 tablet (8 mg total) by mouth every 8 (eight) hours as needed for nausea.     polyethylene glycol packet  Commonly known as:  MIRALAX / GLYCOLAX  Take 17 g by mouth daily as needed.     predniSONE 5 MG tablet  Commonly known as:  DELTASONE  Take 5 mg by mouth 2 (two) times daily.     SANTYL ointment  Generic drug:  collagenase  Apply 1 application topically 2 (two) times daily. To coccyx and cover with dry dressing        Meds ordered this encounter  Medications  . predniSONE (DELTASONE) 5 MG tablet    Sig: Take 5 mg by mouth 2 (two) times  daily.  . Multiple Vitamins-Minerals (DECUBI-VITE PO)    Sig: Take 2 tablets by mouth daily.  Joycelyn Rua Gel Base (HYDROGEL) GEL    Sig: 1 application by Does not apply route daily. Apply to right chest  . ammonium lactate (AMLACTIN) 12 % cream    Sig: Apply 1 g topically 2 (two) times daily.  . collagenase (SANTYL) ointment    Sig: Apply 1 application topically 2 (two) times daily. To coccyx and cover with dry dressing    There is no immunization history for the selected administration types on file for this patient.  History  Substance Use Topics  . Smoking status: Current Every Day Smoker -- 1.00 packs/day    Types: Cigarettes  . Smokeless tobacco: Never Used  . Alcohol Use: No    Filed Vitals:   04/07/13 1531  BP: 146/86  Pulse: 78  Temp: 97.9 F (36.6 C)  Resp: 18    Physical Exam  GENERAL APPEARANCE: Alert, conversant. Appropriately groomed. No acute distress.  HEENT: Unremarkable. RESPIRATORY: Breathing is even, unlabored. Lung sounds are clear   CARDIOVASCULAR: Heart RRR no murmurs, rubs or gallops. No peripheral edema.  GASTROINTESTINAL: Abdomen is soft, non-tender, not distended w/ normal bowel sounds.  NEUROLOGIC: Cranial nerves 2-12 grossly intact. Moves all extremities no tremor.  Patient Active Problem List   Diagnosis Date Noted  . Acute confusional state 03/18/2013  . Unintentional weight loss 03/18/2013  . Severe protein-calorie malnutrition in the context of chronic illness 03/18/2013  . Diaphoresis 03/18/2013  . Bacteremia 03/18/2013  . Infection due to portacath 03/08/2013  . Adult failure to thrive 03/08/2013  . Infection due to port-a-cath 03/08/2013  . HTN (hypertension), benign 01/15/2013  . COPD (chronic obstructive pulmonary disease) 01/15/2013  . IDDM (insulin dependent diabetes mellitus) 01/15/2013  . 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 Cancer associated pain 01/15/2013  . Status post right hip replacement  01/15/2013  . Chronic ulcer of gastric fundus 01/15/2013  . Depression, reactive 01/15/2013  . Prostate cancer, primary, with metastasis from prostate to other site 01/05/2013    CBC    Component Value Date/Time   WBC 6.8 03/25/2013 1014   WBC 7.0 03/19/2013 0337   RBC 4.86 03/25/2013 1014   RBC 4.29 03/19/2013 0337   HGB 13.9 03/25/2013 1014   HGB 12.1* 03/19/2013 0337   HCT 41.9 03/25/2013 1014   HCT 36.4* 03/19/2013 1610  PLT 186 03/25/2013 1014   PLT 173 03/19/2013 0337   MCV 86.1 03/25/2013 1014   MCV 84.8 03/19/2013 0337   LYMPHSABS 1.4 03/25/2013 1014   LYMPHSABS 0.9 03/17/2013 1900   MONOABS 0.7 03/25/2013 1014   MONOABS 0.6 03/17/2013 1900   EOSABS 0.2 03/25/2013 1014   EOSABS 0.0 03/17/2013 1900   BASOSABS 0.1 03/25/2013 1014   BASOSABS 0.0 03/17/2013 1900    CMP     Component Value Date/Time   NA 133* 03/25/2013 1014   NA 135 03/19/2013 0337   K 4.1 03/25/2013 1014   K 4.5 03/19/2013 0337   CL 101 03/19/2013 0337   CO2 21* 03/25/2013 1014   CO2 24 03/19/2013 0337   GLUCOSE 143* 03/25/2013 1014   GLUCOSE 117* 03/19/2013 0337   BUN 15.0 03/25/2013 1014   BUN 13 03/19/2013 0337   CREATININE 1.3 03/25/2013 1014   CREATININE 1.33 03/19/2013 0337   CALCIUM 9.5 03/25/2013 1014   CALCIUM 8.7 03/19/2013 0337   PROT 7.7 03/25/2013 1014   PROT 6.9 03/17/2013 1900   ALBUMIN 3.2* 03/25/2013 1014   ALBUMIN 2.9* 03/17/2013 1900   AST 13 03/25/2013 1014   AST 12 03/17/2013 1900   ALT 15 03/25/2013 1014   ALT 7 03/17/2013 1900   ALKPHOS 123 03/25/2013 1014   ALKPHOS 120* 03/17/2013 1900   BILITOT 0.41 03/25/2013 1014   BILITOT 0.3 03/17/2013 1900   GFRNONAA 55* 03/19/2013 0337   GFRAA 64* 03/19/2013 0337    Assessment and Plan  Patient is being discharged to home in improved condition with OT/PT and with nursing for a chest wound and a sacral wound.  Margit Hanks, MD

## 2013-04-13 ENCOUNTER — Other Ambulatory Visit: Payer: Self-pay | Admitting: *Deleted

## 2013-04-13 ENCOUNTER — Other Ambulatory Visit: Payer: Self-pay | Admitting: Internal Medicine

## 2013-04-13 DIAGNOSIS — I10 Essential (primary) hypertension: Secondary | ICD-10-CM

## 2013-04-13 DIAGNOSIS — C61 Malignant neoplasm of prostate: Secondary | ICD-10-CM

## 2013-04-13 MED ORDER — ABIRATERONE ACETATE 250 MG PO TABS: 1000.0000 mg | ORAL_TABLET | Freq: Every day | ORAL | Status: DC

## 2013-04-15 ENCOUNTER — Telehealth: Payer: Self-pay | Admitting: *Deleted

## 2013-04-15 NOTE — Telephone Encounter (Signed)
Jack Barrett, PT with [facility] called to get verbal order for a medical social worker consult.  This is fine with Dr.Granfortuna.  Called [place] and left VO to get Medical Ecologist.  Phone [phone].

## 2013-04-16 ENCOUNTER — Other Ambulatory Visit: Payer: Self-pay

## 2013-04-16 ENCOUNTER — Other Ambulatory Visit: Payer: Self-pay | Admitting: *Deleted

## 2013-04-16 DIAGNOSIS — C61 Malignant neoplasm of prostate: Secondary | ICD-10-CM

## 2013-04-16 MED ORDER — ALPRAZOLAM 0.5 MG PO TABS: 0.5000 mg | ORAL_TABLET | Freq: Two times a day (BID) | ORAL | Status: DC | PRN

## 2013-04-16 NOTE — Telephone Encounter (Signed)
CALLED TO PHARMACIST, BRIAN.

## 2013-04-21 NOTE — Telephone Encounter (Signed)
RECEIVED A FAX FROM [facility] CONCERNING A PRESCRIPTION REFILL FOR ALPRAZOLAM 1MG . SPOKE TO PT.'S DAUGHTER CONCERNING A REFILL OF ALPRAZOLAM 0.5MG  ON 04/16/13 FOR #30 TABLETS AT THE [facility]. PT.'S DAUGHTER WAS UNSURE IF THE PRESCRIPTION WAS PICK UP YET. SHE WILL CHECK WITH FAMILY MEMBERS. VERBAL ORDER AND READ BACK TO LISA THOMAS,NP- INFORM PT.'S DAUGHTER THAT FUTURE REFILLS FOR ALPRAZOLAM WILL NEED TO BE PRESCRIBED BY PT.'S PRIMARY CARE PHYSICIAN. SHE VOICES UNDERSTANDING. ALSO NOTIFIED [facility] OF THE ABOVE INSTRUCTIONS.

## 2013-04-22 ENCOUNTER — Other Ambulatory Visit: Payer: Self-pay | Admitting: *Deleted

## 2013-04-22 NOTE — Telephone Encounter (Signed)
Received refill request from [facility] for Alprazolam.  Noted that a new prescription was called in to [facility] on  04/16/13.  [facility] pharmacy and informed of same info.

## 2013-05-05 ENCOUNTER — Other Ambulatory Visit: Payer: Medicare Other | Admitting: Lab

## 2013-05-05 ENCOUNTER — Telehealth: Payer: Self-pay | Admitting: Oncology

## 2013-05-05 ENCOUNTER — Ambulatory Visit: Payer: Medicare Other | Admitting: Oncology

## 2013-05-05 NOTE — Telephone Encounter (Signed)
Pt called and r/s  lab and MD to January 2015,nurse notified

## 2013-05-10 ENCOUNTER — Other Ambulatory Visit: Payer: Self-pay | Admitting: Internal Medicine

## 2013-05-11 ENCOUNTER — Other Ambulatory Visit: Payer: Self-pay | Admitting: Oncology

## 2013-05-12 ENCOUNTER — Other Ambulatory Visit: Payer: Self-pay | Admitting: *Deleted

## 2013-05-12 ENCOUNTER — Encounter: Payer: Self-pay | Admitting: Oncology

## 2013-05-12 DIAGNOSIS — G893 Neoplasm related pain (acute) (chronic): Secondary | ICD-10-CM

## 2013-05-12 DIAGNOSIS — C61 Malignant neoplasm of prostate: Secondary | ICD-10-CM

## 2013-05-12 MED ORDER — MORPHINE SULFATE ER 60 MG PO TBCR: 60.0000 mg | EXTENDED_RELEASE_TABLET | Freq: Three times a day (TID) | ORAL | Status: DC

## 2013-05-12 NOTE — Progress Notes (Signed)
Per Jack Barrett this patient was approved for asst with PAN and ZERO on Zytiga.

## 2013-05-12 NOTE — Telephone Encounter (Signed)
Received call from pt's brother, B[place]all, requesting refill on pt's morphine.  It was last filled 04/10/13 by Gwenyth Bender NP who may have been at nursing home at d/c.  Script placed on MD's desk to be signed & brother will pick up this pm.

## 2013-05-15 ENCOUNTER — Telehealth: Payer: Self-pay | Admitting: *Deleted

## 2013-05-15 NOTE — Telephone Encounter (Signed)
Received message from Angie Physical Therapist --[facility] reporting that pt is declining; has no help at home; not getting proper food or his medications.  States pt not able to tolerate PT today and c/o of vertigo.  When offered to call 911--pt refused; pt in unsafe situation and Adult services were called (from an earlier time)  but pt also denied that help.  Angie stated she did leave message for daughter.  Wanted Dr. Patsy Lager office to be aware of situation.  Dr. Cyndie Chime out of office; Lonna Cobb, NP made aware and instructions given to contact Kathrin Penner, Social Worker.  Left message for Kathrin Penner to contact our office regarding this situation.

## 2013-05-22 ENCOUNTER — Telehealth: Payer: Self-pay | Admitting: Oncology

## 2013-05-22 NOTE — Telephone Encounter (Signed)
I was contacted by Jack Barrett on behalf of Jack Barrett asking to refill his Dilaudid.  Reviewing his records indicate his last Rx given by Dr. Darnelle Catalan in 03/2013. I explained to her that I can not refill control substance after office hours and she need to take him to the ED if he develops severe pain.

## 2013-05-27 ENCOUNTER — Telehealth: Payer: Self-pay | Admitting: *Deleted

## 2013-05-27 NOTE — Telephone Encounter (Signed)
Dtr. Calls requesting refill of his hydromorphone- 8mg  tablet.  Last filled #20 on 03-20-13 by Dr. Darnelle Catalan. . Refilled his MS Contin 60mg  TID #90 on 05-12-13.   Will discuss with Dr. Cyndie Chime. Spoke with Kathrin Penner, SW about concerns with home situation.  She contacted [facility].  Another report has been made with APS as there are "lots of red flags."  The [facility] nurse is the only one still making home visits.  Amber/RN [phone]   [facility] SW=Cindy [phone].  [facility] SW is Tia.

## 2013-05-28 ENCOUNTER — Other Ambulatory Visit: Payer: Self-pay | Admitting: *Deleted

## 2013-05-28 ENCOUNTER — Telehealth: Payer: Self-pay | Admitting: *Deleted

## 2013-05-28 DIAGNOSIS — C61 Malignant neoplasm of prostate: Secondary | ICD-10-CM

## 2013-05-28 MED ORDER — OXYCODONE HCL 5 MG PO TABS: 5.0000 mg | ORAL_TABLET | ORAL | Status: DC | PRN

## 2013-05-28 NOTE — Telephone Encounter (Signed)
Spoke with Amber/RN from [facility].  Patient seems to be doing much better/stronger without the dilaudid.  Nephew said he ran out 2 weeks ago and Hospital doctor is seeing a lot of improvement in mobility.  She did  Patient enducation today on the side effects of the narcotics and how he should be taking them.  Discussed with Dr. Cyndie Chime.  Will not refill diluadid, but change patient over to oxycodone for breakthrough pain and give him only a weeks worth and then have [facility]/RN do a pill count.  Discussed with [facility].  She will be there next Monday and Wednesday and will be glad to do a pill count and assess patient.  Called patient and let him know we were changing his breakthrough pain medication.  Reviewed with patient how to take his MS Contin and his oxycodone.  Also reviewed with him the possible side effects of taking too much narcotic.  He verbalized that he did not want to fall and seemed to have a better understanding of the potential problems.  Let him know the script would be ready for pick up later this afternoon or tomorrow.  His dtr. Will pick it up and is aware of the procedure for getting prescriptions here.

## 2013-05-28 NOTE — Telephone Encounter (Signed)
Walk in form at 1605 from patient's daughter reads "Rx not sufficient.Marland KitchenMarland KitchenHydromorphine".  Spoke with daughter who says "Dad was on hydromorphone 8 mg and still wakes up in pain so how is this going to work and it's only a ten day supply".  Informed her this is for "breakthrough pain" not around the clock.  MS Contin is ordered every eight hours around the clock.  We have given script as ordered by Dr. Cyndie Chime.  Daughter says she is going to [place], Dad is going to [place] for Christmas and Oxy-IR won't last.  Will return by first of the year.  Collaborative nurse notified and joined this nurse talking with daughter who says she is also his caregiver.  Verbalized the "nephew should have asked for hydromorphone when the MS-Contin was refilled".  Instructed not to pack these in a pill box but to use only if he demonstrates he is experiencing pain or asks for pain medicine.

## 2013-05-28 NOTE — Telephone Encounter (Signed)
Spoke with Dr. Merrilee Seashore [phone].  She states she probably did write script for dilaudid 8mg  #180 when she discharged him from the nursing home.  Their practice is to write for a month's worth.  However, she is not his PCP and will not be writing further scripts.   Spoke with Cindy/SW@[facility]  [id number]. Spoke with Amber/RN [phone].  Her assessment is that patient often forgets to eat and drink.  He can not remember what medication he has taken.  Amber states that his dtr. Nicolette has not been involved with his car for some time now.  A nephew is taking care of him and he is in and out of the home at random times. He reported to [facility] that patient had been out of his dilaudid for 2 weeks.  His wound care is to be done daily and it is only done when she goes and that is twice a week.  She is going there this am and will try and assess what is going on with his pain medicine and call us back.

## 2013-06-09 ENCOUNTER — Telehealth: Payer: Self-pay | Admitting: *Deleted

## 2013-06-09 ENCOUNTER — Other Ambulatory Visit: Payer: Self-pay | Admitting: *Deleted

## 2013-06-09 DIAGNOSIS — C61 Malignant neoplasm of prostate: Secondary | ICD-10-CM

## 2013-06-09 DIAGNOSIS — G893 Neoplasm related pain (acute) (chronic): Secondary | ICD-10-CM

## 2013-06-09 MED ORDER — MORPHINE SULFATE ER 60 MG PO TBCR: 60.0000 mg | EXTENDED_RELEASE_TABLET | Freq: Three times a day (TID) | ORAL | Status: DC

## 2013-06-09 MED ORDER — HYDROMORPHONE HCL 4 MG PO TABS: ORAL_TABLET | ORAL | Status: DC

## 2013-06-09 NOTE — Telephone Encounter (Signed)
Called and Informed patient's daughter Burna Cash) that prescriptions for MS Contin and [facility]audid [facility]

## 2013-06-09 NOTE — Telephone Encounter (Signed)
Received vm call from pt's daughter, Jack Barrett stating that pt needs refill on his pain meds & wants to go back to hydromorphone instead of the oxycodone & also needs morphine. Also received call from pt, Jack Barrett stating that he went to [place] for the holidays & found that oxycodone was insufficient to his needs stating that he had to take 3 tabs =15 mg for pain to be tolerable & felt that he got better results from the hydromorphone.  Will discuss with Dr. Cyndie Chime.

## 2013-06-12 ENCOUNTER — Other Ambulatory Visit: Payer: Self-pay | Admitting: Oncology

## 2013-06-16 ENCOUNTER — Ambulatory Visit (HOSPITAL_BASED_OUTPATIENT_CLINIC_OR_DEPARTMENT_OTHER): Payer: Medicare Other | Admitting: Oncology

## 2013-06-16 ENCOUNTER — Telehealth: Payer: Self-pay | Admitting: Oncology

## 2013-06-16 ENCOUNTER — Other Ambulatory Visit (HOSPITAL_BASED_OUTPATIENT_CLINIC_OR_DEPARTMENT_OTHER): Payer: Medicare Other

## 2013-06-16 ENCOUNTER — Encounter: Payer: Self-pay | Admitting: Oncology

## 2013-06-16 VITALS — BP 178/87 | HR 77 | Temp 98.5°F | Resp 19 | Ht 77.0 in | Wt 180.4 lb

## 2013-06-16 DIAGNOSIS — C7951 Secondary malignant neoplasm of bone: Secondary | ICD-10-CM

## 2013-06-16 DIAGNOSIS — E119 Type 2 diabetes mellitus without complications: Secondary | ICD-10-CM

## 2013-06-16 DIAGNOSIS — I10 Essential (primary) hypertension: Secondary | ICD-10-CM

## 2013-06-16 DIAGNOSIS — C61 Malignant neoplasm of prostate: Secondary | ICD-10-CM

## 2013-06-16 DIAGNOSIS — G893 Neoplasm related pain (acute) (chronic): Secondary | ICD-10-CM

## 2013-06-16 DIAGNOSIS — C7952 Secondary malignant neoplasm of bone marrow: Secondary | ICD-10-CM

## 2013-06-16 LAB — CBC WITH DIFFERENTIAL/PLATELET
BASO%: 1 % (ref 0.0–2.0)
Basophils Absolute: 0.1 10*3/uL (ref 0.0–0.1)
EOS%: 3.2 % (ref 0.0–7.0)
Eosinophils Absolute: 0.3 10*3/uL (ref 0.0–0.5)
HEMATOCRIT: 41.8 % (ref 38.4–49.9)
HGB: 13.8 g/dL (ref 13.0–17.1)
LYMPH%: 22.3 % (ref 14.0–49.0)
MCH: 28.8 pg (ref 27.2–33.4)
MCHC: 32.9 g/dL (ref 32.0–36.0)
MCV: 87.6 fL (ref 79.3–98.0)
MONO#: 0.6 10*3/uL (ref 0.1–0.9)
MONO%: 6.5 % (ref 0.0–14.0)
NEUT#: 6 10*3/uL (ref 1.5–6.5)
NEUT%: 67 % (ref 39.0–75.0)
Platelets: 165 10*3/uL (ref 140–400)
RBC: 4.77 10*6/uL (ref 4.20–5.82)
RDW: 14.3 % (ref 11.0–14.6)
WBC: 9 10*3/uL (ref 4.0–10.3)
lymph#: 2 10*3/uL (ref 0.9–3.3)

## 2013-06-16 LAB — PSA: PSA: 30.17 ng/mL — ABNORMAL HIGH (ref <=4.00)

## 2013-06-16 LAB — COMPREHENSIVE METABOLIC PANEL (CC13)
ALT: 8 U/L (ref 0–55)
AST: 9 U/L (ref 5–34)
Albumin: 3.2 g/dL — ABNORMAL LOW (ref 3.5–5.0)
Alkaline Phosphatase: 111 U/L (ref 40–150)
Anion Gap: 11 mEq/L (ref 3–11)
BUN: 15.8 mg/dL (ref 7.0–26.0)
CO2: 25 mEq/L (ref 22–29)
Calcium: 9.4 mg/dL (ref 8.4–10.4)
Chloride: 102 mEq/L (ref 98–109)
Creatinine: 1.3 mg/dL (ref 0.7–1.3)
Glucose: 188 mg/dl — ABNORMAL HIGH (ref 70–140)
Potassium: 3.8 mEq/L (ref 3.5–5.1)
Sodium: 138 mEq/L (ref 136–145)
Total Bilirubin: 0.34 mg/dL (ref 0.20–1.20)
Total Protein: 7.3 g/dL (ref 6.4–8.3)

## 2013-06-16 MED ORDER — ABIRATERONE ACETATE 250 MG PO TABS: 1000.0000 mg | ORAL_TABLET | Freq: Every day | ORAL | Status: DC

## 2013-06-16 MED ORDER — PREDNISONE 5 MG PO TABS: 5.0000 mg | ORAL_TABLET | Freq: Two times a day (BID) | ORAL | Status: DC

## 2013-06-16 MED ORDER — HYDROMORPHONE HCL 4 MG PO TABS: ORAL_TABLET | ORAL | Status: DC

## 2013-06-16 MED ORDER — AMLODIPINE BESYLATE 10 MG PO TABS: 10.0000 mg | ORAL_TABLET | Freq: Every day | ORAL | Status: DC

## 2013-06-16 MED ORDER — OXYCODONE HCL 5 MG PO TABS: 5.0000 mg | ORAL_TABLET | ORAL | Status: DC | PRN

## 2013-06-16 NOTE — Progress Notes (Signed)
Per PAN ZERO  Dates of services 03/20/13 and 04/07/13 are not eligible for reimbursement.

## 2013-06-16 NOTE — Telephone Encounter (Signed)
appts made per 1/6 POF AVS and CAL given to pt shh

## 2013-06-17 NOTE — Progress Notes (Signed)
Hematology and Oncology Follow Up Visit  Jack Barrett [id number] [birth year] 68 y.o. 06/17/2013 4:00 PM   Principle Diagnosis: Encounter Diagnoses  Name Primary?  . Prostate cancer, primary, with metastasis from prostate to other site Yes  . Cancer associated pain   . HTN (hypertension), benign      Interim History:   Followup visit for this very polite 68 year old man, retired Pharmacist, hospital, with prostate cancer metastatic to bone. Initial diagnosis of a Gleason 4+5 lesion in August 2007 status post radical prostatectomy with lymph node dissection 09/04/2006. Postop radiation required for local invasion. Please see my initial office consultation dated 01/14/2013 for additional details. He had persistent elevation of his PSA post surgery and was treated with Lupron. PSA continued to rise. He initially received bicalutamide and then Enzalutamide. He had GI intolerance to the Enzalutamide which was stopped. He progressed in bone in May of 2013. He was started on a trial of Taxotere on 09/30/2012. He had poor tolerance to the drug. He was admitted to a hospital in [place] in June 2014 with failure to thrive, dehydration, nausea, vomiting, and abdominal pain. He is to remove them here to [facility] secondary to concerns that his narcotic analgesics were being misused by friends and other family members. He was extremely debilitated on arrival here. I elected to discontinue any further chemotherapy. I started him on a trial of Zytiga with low-dose prednisone in August 2014. His performance status slowly improved. He had a setback when he developed dehiscence of the skin over the Port-A-Cath with a infection requiring hospital admission on September 28, removal of the Port-A-Cath, and parenteral antibiotics. Wound cultures grew MRSA He was admitted to the hospital again in October secondary to deconditioning. He was requiring around-the-clock nursing care which overwhelmed his daughter. He is a diabetic.  He was not eating or drinking. Once again, his situation stabilized.  He has had issues with his pain control regimen. He was very comfortable on the regimen that I started back in August with MS Contin 60 mg every 8 hours with Dilaudid 4-8 mg every 4 hours as needed for breakthrough pain. We received some reports from [facility] that he was oversedated and was doing better when the Dilaudid was stopped. We prescribed oxycodone. However his pain worsened again and his daughter asked that he be put back on the Dilaudid. I prescribed the Dilaudid again last week in anticipation of today's visit. He is very alert and oriented today. He really feels that the Dilaudid for breakthrough works well for him. The daughter does not understand why the home nurses felt he was being overmedicated. The patient feels that they came at times of the day when he was very tired and they may have misinterpreted this for confusion.  He is still having what he feels is "total body pain" with more focal pain in the sternal area.  Medications: reviewed  Allergies:  Allergies  Allergen Reactions  . Aspirin Other (See Comments)    hemmoriage  . Codeine Other (See Comments)    Hallucinations; takes hydromorphone and morphine at home without issue  . Lactose Intolerance (Gi) Diarrhea    Review of Systems: Hematology:  No bleeding or bruising ENT ROS: No sore throat Breast ROS:  Respiratory ROS: No cough or dyspnea Cardiovascular ROS:   No chest pain or palpitations Gastrointestinal ROS:   No abdominal pain or change in bowel habit. Intermittent constipation from the narcotics Genito-Urinary ROS: No new urinary tract symptoms Musculoskeletal ROS:  See above Neurological ROS: No headache or change in vision Dermatological ROS: No rash Remaining ROS negative.  Physical Exam: Blood pressure 178/87, pulse 77, temperature 98.5 F (36.9 C), temperature source Oral, resp. rate 19, height 6\' 5"  (1.956 m),  weight 180 lb 6.4 oz (81.829 kg). Wt Readings from Last 3 Encounters:  06/16/13 180 lb 6.4 oz (81.829 kg)  03/25/13 175 lb 8 oz (79.606 kg)  03/17/13 176 lb (79.833 kg)     General appearance: Tall, thin, African American man HENNT: Pharynx no erythema, exudate, mass, or ulcer. No thyromegaly or thyroid nodules Lymph nodes: No cervical, supraclavicular, or axillary lymphadenopathy Breasts:  Lungs: Clear to auscultation, resonant to percussion throughout Heart: Regular rhythm, no murmur, no gallop, no rub, no click, no edema Abdomen: Soft, nontender, normal bowel sounds, no mass, no organomegaly Extremities: No edema, no calf tenderness Musculoskeletal: no joint deformities GU:  Vascular: Carotid pulses 2+, no bruits, Neurologic: Alert, oriented, PERRLA,, cranial nerves grossly normal, motor strength 5 over 5, reflexes 1+ symmetric, upper body coordination normal, gait normal, Skin: No rash or ecchymosis  Lab Results: CBC W/Diff    Component Value Date/Time   WBC 9.0 06/16/2013 1512   WBC 7.0 03/19/2013 0337   RBC 4.77 06/16/2013 1512   RBC 4.29 03/19/2013 0337   HGB 13.8 06/16/2013 1512   HGB 12.1* 03/19/2013 0337   HCT 41.8 06/16/2013 1512   HCT 36.4* 03/19/2013 0337   PLT 165 06/16/2013 1512   PLT 173 03/19/2013 0337   MCV 87.6 06/16/2013 1512   MCV 84.8 03/19/2013 0337   MCH 28.8 06/16/2013 1512   MCH 28.2 03/19/2013 0337   MCHC 32.9 06/16/2013 1512   MCHC 33.2 03/19/2013 0337   RDW 14.3 06/16/2013 1512   RDW 15.9* 03/19/2013 0337   LYMPHSABS 2.0 06/16/2013 1512   LYMPHSABS 0.9 03/17/2013 1900   MONOABS 0.6 06/16/2013 1512   MONOABS 0.6 03/17/2013 1900   EOSABS 0.3 06/16/2013 1512   EOSABS 0.0 03/17/2013 1900   BASOSABS 0.1 06/16/2013 1512   BASOSABS 0.0 03/17/2013 1900     Chemistry      Component Value Date/Time   NA 138 06/16/2013 1513   NA 135 03/19/2013 0337   K 3.8 06/16/2013 1513   K 4.5 03/19/2013 0337   CL 101 03/19/2013 0337   CO2 25 06/16/2013 1513   CO2 24 03/19/2013 0337   BUN 15.8  06/16/2013 1513   BUN 13 03/19/2013 0337   CREATININE 1.3 06/16/2013 1513   CREATININE 1.33 03/19/2013 0337      Component Value Date/Time   CALCIUM 9.4 06/16/2013 1513   CALCIUM 8.7 03/19/2013 0337   ALKPHOS 111 06/16/2013 1513   ALKPHOS 120* 03/17/2013 1900   AST 9 06/16/2013 1513   AST 12 03/17/2013 1900   ALT 8 06/16/2013 1513   ALT 7 03/17/2013 1900   BILITOT 0.34 06/16/2013 1513   BILITOT 0.3 03/17/2013 1900    PSA: 30 units compared with 15 units in October and 15 units at time of initiation of Zytiga in August.     Impression:  #1. 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 Disease has progressed through all available hormonal maneuvers. In reviewing his record, I noted that we did not continue his Lupron and if it would be reasonable to resume this. I will also add monthly Zometa. I'm going to go ahead and get a bone scan to reevaluate the extent of his bone disease. I landed to reevaluate the use  of chemotherapy. He had poor bone marrow tolerance to every 3 week Taxotere and was changed to weekly Taxotere and just before he moved to [place]. If we do go back on chemotherapy, he'll need a new Port-A-Cath. I called and discussed this with his daughter.  #2. Pain control issues secondary to #1. See discussion above. I will continue his current regimen.  #3. Essential hypertension  #4. Insulin-dependent diabetes  He is trying to get established with a primary care physician to assist in management of his hypertension and diabetes. Dr. York Ram [address] phone [phone], fax [phone] we'll send Dr. Jimmye Norman a copy of this note as well as lab studies we did today.   CC: Patient Care Team: No Pcp Per Patient as PCP - General (General Practice)   Annia Belt, MD 1/7/20154:00 PM

## 2013-06-18 ENCOUNTER — Other Ambulatory Visit: Payer: Self-pay | Admitting: *Deleted

## 2013-06-18 DIAGNOSIS — C61 Malignant neoplasm of prostate: Secondary | ICD-10-CM

## 2013-06-19 ENCOUNTER — Telehealth: Payer: Self-pay | Admitting: Oncology

## 2013-06-19 ENCOUNTER — Telehealth: Payer: Self-pay | Admitting: *Deleted

## 2013-06-19 ENCOUNTER — Other Ambulatory Visit: Payer: Self-pay | Admitting: *Deleted

## 2013-06-19 DIAGNOSIS — C61 Malignant neoplasm of prostate: Secondary | ICD-10-CM

## 2013-06-19 NOTE — Telephone Encounter (Signed)
gave pt's daughter appt for laba nd Zometa on 06/22/13

## 2013-06-19 NOTE — Telephone Encounter (Signed)
Per staff message and POF I have scheduled appts.  JMW  

## 2013-06-19 NOTE — Telephone Encounter (Signed)
Received call from Delia stating that there are safety issues in the home; pt is non-compliant; not taking his meds safely, not listening; unsteady on his feet-wobbly when he gets up; family not there-pt can go all weekend with no one there to help him; no one helping with meds; can't remember when he took his pain med; refused meals on wheels & they are going to have to d/c pt.  She states they have made another call to Adult Protective Services.  She states he went out of town over Christmas & was gone 1& 1/2 weeks to [place] they were never informed.  They know wound care is not being done b/c family has been taught but family not always there & pt isn't eating properly b/c no one prepares his meals & he is not capable of doing this.  She reports that they need a skill to be in the home.  Will report to Dr Beryle Beams.  She states that they can't be responsible for his safety when no one is listening.

## 2013-06-19 NOTE — Telephone Encounter (Signed)
Received vm call from pt stating that he would like a callback regarding nuclear bone scan.  Returned call to pt & he states that daughter is out of town & wants to change return ph # to [phone] & he wanted to know the time of his bone scan & what that entailed.  Noticed that scan ordered for 06/22/13 but not scheduled until 07/07/13.  Will have Scheduler check this out.  Clarified with Dr Beryle Beams that this is too late.  Registration informed of pt's mobile #. Pt called back & wants to know schedule for new port & chemo.  Informed that Dr Beryle Beams was probably waiting on bone scan results before ordering this.  The patient wants Dr Beryle Beams to know that he would like to proceed with chemo & new port ASAP.  Note to Dr Beryle Beams.

## 2013-06-19 NOTE — Telephone Encounter (Signed)
Pr Bobe scan r/s to [facility] on 1/13 11am , pt's daughter is aware

## 2013-06-22 ENCOUNTER — Ambulatory Visit: Payer: Medicare Other

## 2013-06-22 ENCOUNTER — Telehealth: Payer: Self-pay | Admitting: *Deleted

## 2013-06-22 ENCOUNTER — Other Ambulatory Visit: Payer: Medicare Other

## 2013-06-22 NOTE — Telephone Encounter (Signed)
Per desk RN I have called and spoke to the patient's daughter. Apapts moved from today to Wednesday

## 2013-06-22 NOTE — Telephone Encounter (Signed)
Received message from Daylene Katayama [facility]) that pt unable to come to appt @ 1:30 today because "nausea is out of control and he's not eating"  Called and spoke with Winferd Humphrey; he states that pt not able to be transported today because he can't keep anything down and request to move appt to 06/23/13.  States "I'm getting him to take his nausea medicine and then will try and get him to eat/drink."  Instruct Nephew that if pt continues to not keep anything down, becomes worse, to take to ED for evaluation.  Pt's nephew verbalized understanding and again requested appt to be moved.  POF completed and pt's nephew informed schedulers will call with new date/time.  Dr. Beryle Beams made aware.

## 2013-06-23 ENCOUNTER — Telehealth: Payer: Self-pay | Admitting: Oncology

## 2013-06-23 ENCOUNTER — Telehealth: Payer: Self-pay | Admitting: *Deleted

## 2013-06-23 ENCOUNTER — Encounter (HOSPITAL_COMMUNITY): Payer: Medicare Other

## 2013-06-23 ENCOUNTER — Encounter (HOSPITAL_COMMUNITY)
Admission: RE | Admit: 2013-06-23 | Discharge: 2013-06-23 | Disposition: A | Discharge status: Home / Self Care | Payer: Medicare Other | Source: Ambulatory Visit | Attending: Oncology | Admitting: Oncology

## 2013-06-23 DIAGNOSIS — C61 Malignant neoplasm of prostate: Secondary | ICD-10-CM | POA: Insufficient documentation

## 2013-06-23 DIAGNOSIS — G893 Neoplasm related pain (acute) (chronic): Secondary | ICD-10-CM

## 2013-06-23 MED ORDER — TECHNETIUM TC 99M MEDRONATE IV KIT
25.0000 | PACK | Freq: Once | INTRAVENOUS | Status: AC | PRN
  Administered 2013-06-23: 25 via INTRAVENOUS

## 2013-06-23 NOTE — Telephone Encounter (Signed)
Called Jack Barrett left VM regarding chemo being r/s from 1/14 to 1/15 or 1/16th

## 2013-06-23 NOTE — Telephone Encounter (Signed)
Pr voicemail message from scheduler I have canceled tomorrow appt and moved to Friday. I have called and gave the daughter the appt date/time. Patient's moved per patient request

## 2013-06-24 ENCOUNTER — Other Ambulatory Visit: Payer: Medicare Other

## 2013-06-24 ENCOUNTER — Ambulatory Visit: Payer: Medicare Other

## 2013-06-25 ENCOUNTER — Other Ambulatory Visit (HOSPITAL_COMMUNITY): Payer: Self-pay | Admitting: Radiology

## 2013-06-26 ENCOUNTER — Ambulatory Visit (HOSPITAL_COMMUNITY)
Admission: RE | Admit: 2013-06-26 | Discharge: 2013-06-26 | Disposition: A | Discharge status: Home / Self Care | Payer: Medicare Other | Source: Ambulatory Visit | Attending: Oncology | Admitting: Oncology

## 2013-06-26 ENCOUNTER — Other Ambulatory Visit: Payer: Self-pay | Admitting: Oncology

## 2013-06-26 ENCOUNTER — Encounter: Payer: Self-pay | Admitting: Medical Oncology

## 2013-06-26 ENCOUNTER — Encounter (HOSPITAL_COMMUNITY): Payer: Medicare Other

## 2013-06-26 ENCOUNTER — Ambulatory Visit (HOSPITAL_BASED_OUTPATIENT_CLINIC_OR_DEPARTMENT_OTHER): Payer: Medicare Other

## 2013-06-26 ENCOUNTER — Ambulatory Visit (HOSPITAL_COMMUNITY): Payer: Medicare Other

## 2013-06-26 ENCOUNTER — Other Ambulatory Visit (HOSPITAL_BASED_OUTPATIENT_CLINIC_OR_DEPARTMENT_OTHER): Payer: Medicare Other

## 2013-06-26 VITALS — BP 167/69 | HR 69 | Temp 97.8°F | Resp 18

## 2013-06-26 DIAGNOSIS — R5383 Other fatigue: Secondary | ICD-10-CM

## 2013-06-26 DIAGNOSIS — Z96649 Presence of unspecified artificial hip joint: Secondary | ICD-10-CM | POA: Insufficient documentation

## 2013-06-26 DIAGNOSIS — J4489 Other specified chronic obstructive pulmonary disease: Secondary | ICD-10-CM | POA: Insufficient documentation

## 2013-06-26 DIAGNOSIS — C7951 Secondary malignant neoplasm of bone: Secondary | ICD-10-CM

## 2013-06-26 DIAGNOSIS — C61 Malignant neoplasm of prostate: Secondary | ICD-10-CM | POA: Insufficient documentation

## 2013-06-26 DIAGNOSIS — C7952 Secondary malignant neoplasm of bone marrow: Secondary | ICD-10-CM

## 2013-06-26 DIAGNOSIS — G909 Disorder of the autonomic nervous system, unspecified: Secondary | ICD-10-CM | POA: Insufficient documentation

## 2013-06-26 DIAGNOSIS — I10 Essential (primary) hypertension: Secondary | ICD-10-CM | POA: Insufficient documentation

## 2013-06-26 DIAGNOSIS — Z794 Long term (current) use of insulin: Secondary | ICD-10-CM | POA: Insufficient documentation

## 2013-06-26 DIAGNOSIS — R112 Nausea with vomiting, unspecified: Secondary | ICD-10-CM | POA: Insufficient documentation

## 2013-06-26 DIAGNOSIS — F341 Dysthymic disorder: Secondary | ICD-10-CM | POA: Insufficient documentation

## 2013-06-26 DIAGNOSIS — E1149 Type 2 diabetes mellitus with other diabetic neurological complication: Secondary | ICD-10-CM | POA: Insufficient documentation

## 2013-06-26 DIAGNOSIS — C801 Malignant (primary) neoplasm, unspecified: Secondary | ICD-10-CM | POA: Insufficient documentation

## 2013-06-26 DIAGNOSIS — R5381 Other malaise: Secondary | ICD-10-CM | POA: Insufficient documentation

## 2013-06-26 DIAGNOSIS — Z79899 Other long term (current) drug therapy: Secondary | ICD-10-CM | POA: Insufficient documentation

## 2013-06-26 DIAGNOSIS — J449 Chronic obstructive pulmonary disease, unspecified: Secondary | ICD-10-CM | POA: Insufficient documentation

## 2013-06-26 DIAGNOSIS — M549 Dorsalgia, unspecified: Secondary | ICD-10-CM | POA: Insufficient documentation

## 2013-06-26 DIAGNOSIS — F17200 Nicotine dependence, unspecified, uncomplicated: Secondary | ICD-10-CM | POA: Insufficient documentation

## 2013-06-26 DIAGNOSIS — Z5111 Encounter for antineoplastic chemotherapy: Secondary | ICD-10-CM

## 2013-06-26 HISTORY — PX: PORTACATH PLACEMENT: SHX2246

## 2013-06-26 LAB — CBC WITH DIFFERENTIAL/PLATELET
BASO%: 0.8 % (ref 0.0–2.0)
Basophils Absolute: 0.1 10*3/uL (ref 0.0–0.1)
EOS%: 3.8 % (ref 0.0–7.0)
Eosinophils Absolute: 0.3 10*3/uL (ref 0.0–0.5)
HEMATOCRIT: 44.4 % (ref 38.4–49.9)
HGB: 14.5 g/dL (ref 13.0–17.1)
LYMPH%: 15.9 % (ref 14.0–49.0)
MCH: 28.4 pg (ref 27.2–33.4)
MCHC: 32.6 g/dL (ref 32.0–36.0)
MCV: 87 fL (ref 79.3–98.0)
MONO#: 0.6 10*3/uL (ref 0.1–0.9)
MONO%: 8.9 % (ref 0.0–14.0)
NEUT#: 5.2 10*3/uL (ref 1.5–6.5)
NEUT%: 70.6 % (ref 39.0–75.0)
PLATELETS: 166 10*3/uL (ref 140–400)
RBC: 5.11 10*6/uL (ref 4.20–5.82)
RDW: 14 % (ref 11.0–14.6)
WBC: 7.3 10*3/uL (ref 4.0–10.3)
lymph#: 1.2 10*3/uL (ref 0.9–3.3)

## 2013-06-26 LAB — CBC
HCT: 39.6 % (ref 39.0–52.0)
HEMOGLOBIN: 12.9 g/dL — AB (ref 13.0–17.0)
MCH: 27.8 pg (ref 26.0–34.0)
MCHC: 32.6 g/dL (ref 30.0–36.0)
MCV: 85.3 fL (ref 78.0–100.0)
Platelets: 141 10*3/uL — ABNORMAL LOW (ref 150–400)
RBC: 4.64 MIL/uL (ref 4.22–5.81)
RDW: 13.8 % (ref 11.5–15.5)
WBC: 6.8 10*3/uL (ref 4.0–10.5)

## 2013-06-26 LAB — COMPREHENSIVE METABOLIC PANEL (CC13)
ALT: 8 U/L (ref 0–55)
AST: 11 U/L (ref 5–34)
Albumin: 3.3 g/dL — ABNORMAL LOW (ref 3.5–5.0)
Alkaline Phosphatase: 107 U/L (ref 40–150)
Anion Gap: 12 mEq/L — ABNORMAL HIGH (ref 3–11)
BILIRUBIN TOTAL: 0.6 mg/dL (ref 0.20–1.20)
BUN: 20.2 mg/dL (ref 7.0–26.0)
CO2: 26 mEq/L (ref 22–29)
CREATININE: 1.2 mg/dL (ref 0.7–1.3)
Calcium: 9.9 mg/dL (ref 8.4–10.4)
Chloride: 101 mEq/L (ref 98–109)
Glucose: 169 mg/dl — ABNORMAL HIGH (ref 70–140)
Potassium: 3.7 mEq/L (ref 3.5–5.1)
Sodium: 139 mEq/L (ref 136–145)
Total Protein: 7.4 g/dL (ref 6.4–8.3)

## 2013-06-26 LAB — BASIC METABOLIC PANEL
BUN: 20 mg/dL (ref 6–23)
CO2: 26 meq/L (ref 19–32)
Calcium: 9.2 mg/dL (ref 8.4–10.5)
Chloride: 99 mEq/L (ref 96–112)
Creatinine, Ser: 1.09 mg/dL (ref 0.50–1.35)
GFR calc Af Amer: 80 mL/min — ABNORMAL LOW (ref >90)
GFR calc non Af Amer: 69 mL/min — ABNORMAL LOW (ref >90)
GLUCOSE: 171 mg/dL — AB (ref 70–99)
POTASSIUM: 3.9 meq/L (ref 3.7–5.3)
SODIUM: 139 meq/L (ref 137–147)

## 2013-06-26 LAB — PROTIME-INR
INR: 1.06 (ref 0.00–1.49)
PROTHROMBIN TIME: 13.6 s (ref 11.6–15.2)

## 2013-06-26 LAB — PSA: PSA: 38.37 ng/mL — ABNORMAL HIGH (ref <=4.00)

## 2013-06-26 LAB — APTT: APTT: 25 s (ref 24–37)

## 2013-06-26 LAB — GLUCOSE, CAPILLARY: Glucose-Capillary: 164 mg/dL — ABNORMAL HIGH (ref 70–99)

## 2013-06-26 MED ORDER — LEUPROLIDE ACETATE (3 MONTH) 22.5 MG IM KIT
22.5000 mg | PACK | Freq: Once | INTRAMUSCULAR | Status: AC
  Administered 2013-06-26: 22.5 mg via INTRAMUSCULAR
  Filled 2013-06-26: qty 22.5

## 2013-06-26 MED ORDER — LIDOCAINE HCL 1 % IJ SOLN
INTRAMUSCULAR | Status: AC
  Filled 2013-06-26: qty 20

## 2013-06-26 MED ORDER — MIDAZOLAM HCL 2 MG/2ML IJ SOLN
INTRAMUSCULAR | Status: AC
  Filled 2013-06-26: qty 4

## 2013-06-26 MED ORDER — FENTANYL CITRATE 0.05 MG/ML IJ SOLN
INTRAMUSCULAR | Status: AC
  Filled 2013-06-26: qty 4

## 2013-06-26 MED ORDER — HYDROMORPHONE HCL PF 4 MG/ML IJ SOLN
2.0000 mg | Freq: Once | INTRAMUSCULAR | Status: AC
  Administered 2013-06-26: 2 mg via INTRAVENOUS

## 2013-06-26 MED ORDER — ONDANSETRON 8 MG/50ML IVPB (CHCC)
8.0000 mg | Freq: Once | INTRAVENOUS | Status: AC
  Administered 2013-06-26: 8 mg via INTRAVENOUS

## 2013-06-26 MED ORDER — HYDROMORPHONE HCL PF 4 MG/ML IJ SOLN
INTRAMUSCULAR | Status: AC
  Filled 2013-06-26: qty 1

## 2013-06-26 MED ORDER — VANCOMYCIN HCL IN DEXTROSE 1-5 GM/200ML-% IV SOLN
1000.0000 mg | Freq: Once | INTRAVENOUS | Status: AC
  Administered 2013-06-26: 1000 mg via INTRAVENOUS
  Filled 2013-06-26: qty 200

## 2013-06-26 MED ORDER — CEFAZOLIN SODIUM-DEXTROSE 2-3 GM-% IV SOLR: 2.0000 g | Freq: Once | INTRAVENOUS | Status: DC

## 2013-06-26 MED ORDER — SODIUM CHLORIDE 0.9 % IV SOLN
Freq: Once | INTRAVENOUS | Status: AC
  Administered 2013-06-26: 13:00:00 via INTRAVENOUS

## 2013-06-26 MED ORDER — ONDANSETRON 8 MG/NS 50 ML IVPB
INTRAVENOUS | Status: AC
  Filled 2013-06-26: qty 8

## 2013-06-26 MED ORDER — MIDAZOLAM HCL 2 MG/2ML IJ SOLN
INTRAMUSCULAR | Status: AC | PRN
  Administered 2013-06-26: 2 mg via INTRAVENOUS

## 2013-06-26 MED ORDER — HEPARIN SOD (PORK) LOCK FLUSH 100 UNIT/ML IV SOLN
500.0000 [IU] | Freq: Once | INTRAVENOUS | Status: AC
  Administered 2013-06-26: 500 [IU] via INTRAVENOUS

## 2013-06-26 MED ORDER — FENTANYL CITRATE 0.05 MG/ML IJ SOLN
INTRAMUSCULAR | Status: AC | PRN
  Administered 2013-06-26: 100 ug via INTRAVENOUS

## 2013-06-26 MED ORDER — ZOLEDRONIC ACID 4 MG/100ML IV SOLN
4.0000 mg | Freq: Once | INTRAVENOUS | Status: AC
  Administered 2013-06-26: 4 mg via INTRAVENOUS
  Filled 2013-06-26: qty 100

## 2013-06-26 MED ORDER — HEPARIN SOD (PORK) LOCK FLUSH 100 UNIT/ML IV SOLN
INTRAVENOUS | Status: AC
  Filled 2013-06-26: qty 5

## 2013-06-26 NOTE — H&P (Signed)
Jack Barrett is an 68 y.o. male.   Chief Complaint: "I'm here for a port" HPI: Patient with history of hormone refractory metastatic prostate carcinoma presents today for port a cath placement for chemotherapy. Pt has prior hx of infected rt chest wall [facility] 02/2013 which grew MRSA in pocket and required removal. He also received chemotherapy today via peripheral IV and had 3 episodes of emesis this morning with intermittent nausea but none since arrival to [facility].  Past Medical History  Diagnosis Date  . HTN (hypertension), benign 01/15/2013  . COPD (chronic obstructive pulmonary disease) 01/15/2013  . IDDM (insulin dependent diabetes mellitus) 01/15/2013  . 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 Cancer associated pain 01/15/2013  . Status post right hip replacement 01/15/2013  . Chronic ulcer of gastric fundus 01/15/2013    EGD 11/24/12  . Depression, reactive 01/15/2013  . Decubitus ulcer   . Prostate cancer, primary, with metastasis from prostate to other site 01/05/2013    Past Surgical History  Procedure Laterality Date  . Total hip arthroplasty    . Prostate surgery    . Porta cath    . Skin grafts    . Port-a-cath removal Right 03/09/2013    Procedure: REMOVAL PORT-A-CATH;  Surgeon: Robyne Askew, MD;  Location: [facility];  Service: General;  Laterality: Right;    History reviewed. No pertinent family history. Social History:  reports that he has been smoking Cigarettes.  He has been smoking about 1.00 pack per day. He has never used smokeless tobacco. He reports that he does not drink alcohol or use illicit drugs.  Allergies:  Allergies  Allergen Reactions  . Aspirin Other (See Comments)    hemmoriage  . Codeine Other (See Comments)    Hallucinations; takes hydromorphone and morphine at home without issue  . Lactose Intolerance (Gi) Diarrhea    Current outpatient prescriptions:ALPRAZolam (XANAX) 0.5 MG tablet, Take 0.5 mg by mouth 2 (two) times daily as needed for sleep  or anxiety., Disp: , Rfl: ;  amLODipine (NORVASC) 10 MG tablet, Take 10 mg by mouth daily with breakfast., Disp: , Rfl: ;  calcium carbonate (OS-CAL) 600 MG TABS tablet, Take 600 mg by mouth daily with breakfast., Disp: , Rfl:  Carbomer Gel Base (HYDROGEL) GEL, 1 application by Does not apply route daily. Apply to right chest, Disp: , Rfl: ;  cholecalciferol (VITAMIN D) 1000 UNITS tablet, Take 1,000 Units by mouth daily., Disp: , Rfl: ;  hydrALAZINE (APRESOLINE) 25 MG tablet, Take 25 mg by mouth 3 (three) times daily., Disp: , Rfl: ;  HYDROmorphone (DILAUDID) 4 MG tablet, Take 4 mg by mouth every 4 (four) hours as needed for severe pain., Disp: , Rfl:  morphine (MS CONTIN) 60 MG 12 hr tablet, Take 60 mg by mouth 3 (three) times daily after meals., Disp: , Rfl: ;  Multiple Vitamins-Minerals (DECUBI-VITE PO), Take 2 tablets by mouth daily., Disp: , Rfl: ;  oxyCODONE (OXY IR/ROXICODONE) 5 MG immediate release tablet, Take 5 mg by mouth every 4 (four) hours as needed for severe pain., Disp: , Rfl:  polyethylene glycol (MIRALAX / GLYCOLAX) packet, Take 17 g by mouth daily as needed for mild constipation., Disp: , Rfl: ;  predniSONE (DELTASONE) 5 MG tablet, Take 5 mg by mouth 2 (two) times daily with a meal., Disp: , Rfl: ;  abiraterone Acetate (ZYTIGA) 250 MG tablet, Take 1,000 mg by mouth daily. Take on an empty stomach 1 hour before or 2  hours after a meal, Disp: , Rfl:  ammonium lactate (AMLACTIN) 12 % cream, Apply 1 g topically 2 (two) times daily., Disp: , Rfl: ;  collagenase (SANTYL) ointment, Apply 1 application topically 2 (two) times daily. To coccyx and cover with dry dressing, Disp: , Rfl: ;  insulin aspart (NOVOLOG) 100 UNIT/ML injection, Inject 5 Units into the skin 3 (three) times daily with meals., Disp: , Rfl:  nicotine (NICODERM CQ - DOSED IN MG/24 HOURS) 14 mg/24hr patch, Place 1 patch onto the skin daily., Disp: , Rfl: ;  ondansetron (ZOFRAN-ODT) 8 MG disintegrating tablet, Take 8 mg by mouth  every 8 (eight) hours as needed for nausea., Disp: , Rfl:  Current facility-administered medications:vancomycin (VANCOCIN) IVPB 1000 mg/200 mL premix, 1,000 mg, Intravenous, Once, D Rowe Robert, PA-C   Results for orders placed during the hospital encounter of 06/26/13 (from the past 48 hour(s))  APTT     Status: None   Collection Time    06/26/13  1:13 PM      Result Value Range   aPTT 25  24 - 37 seconds  BASIC METABOLIC PANEL     Status: Abnormal   Collection Time    06/26/13  1:13 PM      Result Value Range   Sodium 139  137 - 147 mEq/L   Potassium 3.9  3.7 - 5.3 mEq/L   Chloride 99  96 - 112 mEq/L   CO2 26  19 - 32 mEq/L   Glucose, Bld 171 (*) 70 - 99 mg/dL   BUN 20  6 - 23 mg/dL   Creatinine, Ser 1.09  0.50 - 1.35 mg/dL   Calcium 9.2  8.4 - 10.5 mg/dL   GFR calc non Af Amer 69 (*) >90 mL/min   GFR calc Af Amer 80 (*) >90 mL/min   Comment: (NOTE)     The eGFR has been calculated using the CKD EPI equation.     This calculation has not been validated in all clinical situations.     eGFR's persistently <90 mL/min signify possible Chronic Kidney     Disease.  CBC     Status: Abnormal   Collection Time    06/26/13  1:13 PM      Result Value Range   WBC 6.8  4.0 - 10.5 K/uL   RBC 4.64  4.22 - 5.81 MIL/uL   Hemoglobin 12.9 (*) 13.0 - 17.0 g/dL   HCT 39.6  39.0 - 52.0 %   MCV 85.3  78.0 - 100.0 fL   MCH 27.8  26.0 - 34.0 pg   MCHC 32.6  30.0 - 36.0 g/dL   RDW 13.8  11.5 - 15.5 %   Platelets 141 (*) 150 - 400 K/uL  PROTIME-INR     Status: None   Collection Time    06/26/13  1:13 PM      Result Value Range   Prothrombin Time 13.6  11.6 - 15.2 seconds   INR 1.06  0.00 - 1.49   No results found.  Review of Systems  Constitutional: Positive for malaise/fatigue.       Recent "hot/cold spells"  HENT:       Occ HA's  Respiratory: Negative for hemoptysis.        Occ cough, dyspnea  Cardiovascular: Negative for chest pain.  Gastrointestinal: Positive for nausea and  vomiting.       Occ abd pain  Musculoskeletal: Positive for back pain.  Neurological: Positive for weakness.  Blood pressure 158/91, pulse 72, temperature 97.1 F (36.2 C), temperature source Oral, resp. rate 18, height $RemoveBe'6\' 5"'TyatUIxqr$  (1.956 m), weight 180 lb (81.647 kg), SpO2 97.00%. Physical Exam  Constitutional: He is oriented to person, place, and time.  Cardiovascular: Normal rate and regular rhythm.   Respiratory:  Distant BS bilat with few scatt rhonchi  GI: Soft. Bowel sounds are normal. There is no tenderness.  Musculoskeletal: Normal range of motion. He exhibits no edema.  Neurological: He is alert and oriented to person, place, and time.     Assessment/Plan Patient with history of hormone refractory metastatic prostate carcinoma presents today for port a cath placement for chemotherapy. Pt has prior hx of infected rt chest wall [facility] 02/2013 which grew MRSA in pocket and required removal. Will plan port placement in left chest wall today. Details/risks of procedure d/w pt with his understanding and consent.  Donterrius Santucci,D KEVIN 06/26/2013, 1:54 PM

## 2013-06-26 NOTE — Discharge Instructions (Signed)
Moderate Sedation, Adult °Moderate sedation is given to help you relax or even sleep through a procedure. You may remain sleepy, be clumsy, or have poor balance for several hours following this procedure. Arrange for a responsible adult, family member, or friend to take you home. A responsible adult should stay with you for at least 24 hours or until the medicines have worn off. °· Do not participate in any activities where you could become injured for the next 24 hours, or until you feel normal again. Do not: °· Drive. °· Swim. °· Ride a bicycle. °· Operate heavy machinery. °· Cook. °· Use power tools. °· Climb ladders. °· Work at heights. °· Do not make important decisions or sign legal documents until you are improved. °· Vomiting may occur if you eat too soon. When you can drink without vomiting, try water, juice, or soup. Try solid foods if you feel little or no nausea. °· Only take over-the-counter or prescription medications for pain, discomfort, or fever as directed by your caregiver.If pain medications have been prescribed for you, ask your caregiver how soon it is safe to take them. °· Make sure you and your family fully understands everything about the medication given to you. Make sure you understand what side effects may occur. °· You should not drink alcohol, take sleeping pills, or medications that cause drowsiness for at least 24 hours. °· If you smoke, do not smoke alone. °· If you are feeling better, you may resume normal activities 24 hours after receiving sedation. °· Keep all appointments as scheduled. Follow all instructions. °· Ask questions if you do not understand. °SEEK MEDICAL CARE IF:  °· Your skin is pale or bluish in color. °· You continue to feel sick to your stomach (nauseous) or throw up (vomit). °· Your pain is getting worse and not helped by medication. °· You have bleeding or swelling. °· You are still sleepy or feeling clumsy after 24 hours. °SEEK IMMEDIATE MEDICAL CARE IF:   °· You develop a rash. °· You have difficulty breathing. °· You develop any type of allergic problem. °· You have a fever. °Document Released: 02/20/2001 Document Revised: 08/20/2011 Document Reviewed: 02/02/2013 °ExitCare® Patient Information ©2014 [employer]. °Implanted Port Insertion, Care After °Refer to this sheet in the next few weeks. These instructions provide you with information on caring for yourself after your procedure. Your health care provider may also give you more specific instructions. Your treatment has been planned according to current medical practices, but problems sometimes occur. Call your health care provider if you have any problems or questions after your procedure. °WHAT TO EXPECT AFTER THE PROCEDURE °After your procedure, it is typical to have the following:  °· Discomfort at the port insertion site. Ice packs to the area will help. °· Bruising on the skin over the port. This will subside in 3 4 days. °HOME CARE INSTRUCTIONS °· After your port is placed, you will get a manufacturer's information card. The card has information about your port. Keep this card with you at all times.   °· Know what kind of port you have. There are many types of ports available.   °· Wear a medical alert bracelet in case of an emergency. This can help alert health care workers that you have a port.   °· The port can stay in for as long as your health care provider believes it is necessary.   °· A home health care nurse may give medicines and take care of the port.   °·   You or a family member can get special training and directions for giving medicine and taking care of the port at home.   °SEEK MEDICAL CARE IF:  °Your port does not flush or you are unable to get a blood return.    °SEEK IMMEDIATE MEDICAL CARE IF: °· You have new fluid or pus coming from your incision.   °· You notice a bad smell coming from your incision site.   °· You have swelling, pain, or more redness at the incision or port site.    °· You have a fever or chills.   °· You have chest pain or shortness of breath. °Document Released: 03/18/2013 Document Reviewed: 02/02/2013 °ExitCare® Patient Information ©2014 [employer]. ° °

## 2013-06-26 NOTE — Procedures (Signed)
LIJV PAC tip SVC RA No comp

## 2013-06-26 NOTE — Progress Notes (Signed)
Pt states pain in tailbone and bilat legs 9/10.  He is scheduled for port a cath placement in one hour.   States took pain meds at home this morning but he vomited the pill.  Notified Dr. Driscilla Moats and verbal order for dilaudid IV given 2mg .  Pt also given zofran for c/o nausea and vomited this morning.  Pt d/c'd to [facility] admitting accompanied by Nephew for port a cath placement.  Stated pain level down to 5/10.

## 2013-06-26 NOTE — Patient Instructions (Signed)
Zoledronic Acid injection  What is this medicine? ZOLEDRONIC ACID (ZOE le dron ik AS id) lowers the amount of calcium loss from bone. It is used to treat too much calcium in your blood from cancer. It is also used to prevent complications of cancer that has spread to the bone. This medicine may be used for other purposes; ask your health care provider or pharmacist if you have questions. COMMON BRAND NAME(S): Zometa What should I tell my health care provider before I take this medicine? They need to know if you have any of these conditions: -aspirin-sensitive asthma -cancer, especially if you are receiving medicines used to treat cancer -dental disease or wear dentures -infection -kidney disease -receiving corticosteroids like dexamethasone or prednisone -an unusual or allergic reaction to zoledronic acid, other medicines, foods, dyes, or preservatives -pregnant or trying to get pregnant -breast-feeding How should I use this medicine? This medicine is for infusion into a vein. It is given by a health care professional in a hospital or clinic setting. Talk to your pediatrician regarding the use of this medicine in children. Special care may be needed. Overdosage: If you think you have taken too much of this medicine contact a poison control center or emergency room at once. NOTE: This medicine is only for you. Do not share this medicine with others. What if I miss a dose? It is important not to miss your dose. Call your doctor or health care professional if you are unable to keep an appointment. What may interact with this medicine? -certain antibiotics given by injection -NSAIDs, medicines for pain and inflammation, like ibuprofen or naproxen -some diuretics like bumetanide, furosemide -teriparatide -thalidomide This list may not describe all possible interactions. Give your health care provider a list of all the medicines, herbs, non-prescription drugs, or dietary supplements you use.  Also tell them if you smoke, drink alcohol, or use illegal drugs. Some items may interact with your medicine. What should I watch for while using this medicine? Visit your doctor or health care professional for regular checkups. It may be some time before you see the benefit from this medicine. Do not stop taking your medicine unless your doctor tells you to. Your doctor may order blood tests or other tests to see how you are doing. Women should inform their doctor if they wish to become pregnant or think they might be pregnant. There is a potential for serious side effects to an unborn child. Talk to your health care professional or pharmacist for more information. You should make sure that you get enough calcium and vitamin D while you are taking this medicine. Discuss the foods you eat and the vitamins you take with your health care professional. Some people who take this medicine have severe bone, joint, and/or muscle pain. This medicine may also increase your risk for jaw problems or a broken thigh bone. Tell your doctor right away if you have severe pain in your jaw, bones, joints, or muscles. Tell your doctor if you have any pain that does not go away or that gets worse. Tell your dentist and dental surgeon that you are taking this medicine. You should not have major dental surgery while on this medicine. See your dentist to have a dental exam and fix any dental problems before starting this medicine. Take good care of your teeth while on this medicine. Make sure you see your dentist for regular follow-up appointments. What side effects may I notice from receiving this medicine? Side effects that you  should report to your doctor or health care professional as soon as possible: -allergic reactions like skin rash, itching or hives, swelling of the face, lips, or tongue -anxiety, confusion, or depression -breathing problems -changes in vision -eye pain -feeling faint or lightheaded, falls -jaw  pain, especially after dental work -mouth sores -muscle cramps, stiffness, or weakness -trouble passing urine or change in the amount of urine Side effects that usually do not require medical attention (report to your doctor or health care professional if they continue or are bothersome): -bone, joint, or muscle pain -constipation -diarrhea -fever -hair loss -irritation at site where injected -loss of appetite -nausea, vomiting -stomach upset -trouble sleeping -trouble swallowing -weak or tired This list may not describe all possible side effects. Call your doctor for medical advice about side effects. You may report side effects to FDA at 1-800-FDA-1088. Where should I keep my medicine? This drug is given in a hospital or clinic and will not be stored at home. NOTE: This sheet is a summary. It may not cover all possible information. If you have questions about this medicine, talk to your doctor, pharmacist, or health care provider.  2014, Elsevier/Gold Standard. (2012-11-06 13:03:13)  Leuprolide injection What is this medicine? LEUPROLIDE (loo PROE lide) is a man-made hormone. It is used to treat the symptoms of prostate cancer. This medicine may also be used to treat children with early onset of puberty. It may be used for other hormonal conditions. This medicine may be used for other purposes; ask your health care provider or pharmacist if you have questions. COMMON BRAND NAME(S): Lupron What should I tell my health care provider before I take this medicine? They need to know if you have any of these conditions: -diabetes -heart disease or previous heart attack -high blood pressure -high cholesterol -pain or difficulty passing urine -spinal cord metastasis -stroke -tobacco smoker -an unusual or allergic reaction to leuprolide, benzyl alcohol, other medicines, foods, dyes, or preservatives -pregnant or trying to get pregnant -breast-feeding How should I use this  medicine? This medicine is for injection under the skin or into a muscle. You will be taught how to prepare and give this medicine. Use exactly as directed. Take your medicine at regular intervals. Do not take your medicine more often than directed. It is important that you put your used needles and syringes in a special sharps container. Do not put them in a trash can. If you do not have a sharps container, call your pharmacist or healthcare provider to get one. Talk to your pediatrician regarding the use of this medicine in children. While this medicine may be prescribed for children as young as 8 years for selected conditions, precautions do apply. Overdosage: If you think you have taken too much of this medicine contact a poison control center or emergency room at once. NOTE: This medicine is only for you. Do not share this medicine with others. What if I miss a dose? If you miss a dose, take it as soon as you can. If it is almost time for your next dose, take only that dose. Do not take double or extra doses. What may interact with this medicine? Do not take this medicine with any of the following medications: -chasteberry This medicine may also interact with the following medications: -herbal or dietary supplements, like black cohosh or DHEA -male hormones, like estrogens or progestins and birth control pills, patches, rings, or injections -male hormones, like testosterone This list may not describe all possible interactions.  Give your health care provider a list of all the medicines, herbs, non-prescription drugs, or dietary supplements you use. Also tell them if you smoke, drink alcohol, or use illegal drugs. Some items may interact with your medicine. What should I watch for while using this medicine? Visit your doctor or health care professional for regular checks on your progress. During the first week, your symptoms may get worse, but then will improve as you continue your treatment.  You may get hot flashes, increased bone pain, increased difficulty passing urine, or an aggravation of nerve symptoms. Discuss these effects with your doctor or health care professional, some of them may improve with continued use of this medicine. Male patients may experience a menstrual cycle or spotting during the first 2 months of therapy with this medicine. If this continues, contact your doctor or health care professional. What side effects may I notice from receiving this medicine? Side effects that you should report to your doctor or health care professional as soon as possible: -allergic reactions like skin rash, itching or hives, swelling of the face, lips, or tongue -breathing problems -chest pain -depression or memory disorders -pain in your legs or groin -pain at site where injected -severe headache -swelling of the feet and legs -visual changes -vomiting Side effects that usually do not require medical attention (report to your doctor or health care professional if they continue or are bothersome): -breast swelling or tenderness -decrease in sex drive or performance -diarrhea -hot flashes -loss of appetite -muscle, joint, or bone pains -nausea -redness or irritation at site where injected -skin problems or acne This list may not describe all possible side effects. Call your doctor for medical advice about side effects. You may report side effects to FDA at 1-800-FDA-1088. Where should I keep my medicine? Keep out of the reach of children. Store below 25 degrees C (77 degrees F). Do not freeze. Protect from light. Do not use if it is not clear or if there are particles present. Throw away any unused medicine after the expiration date. NOTE: This sheet is a summary. It may not cover all possible information. If you have questions about this medicine, talk to your doctor, pharmacist, or health care provider.  2014, Elsevier/Gold Standard. (2008-10-12 13:26:20)

## 2013-07-01 ENCOUNTER — Telehealth: Payer: Self-pay | Admitting: *Deleted

## 2013-07-01 NOTE — Telephone Encounter (Signed)
Received call from pt stating that he has had a rough am & has been expecting [facility] to be out on Mon & thought that this was set up.  He states that he has been on a [place]Go[place] about this.  He talked to someone today & was told referral had not been made. ( Referral was made 06/19/13)  He told me that his daughter brought him here from [facility] to take care of him & there have been some issues with his daughter.  He states that he gave her [place] & he has not been pleased with how she handled things.  He has moved into his own apartment & his nephew is on the lease.  He states that his daughter is upset that he has moved out & she has lost control of his money.  Informed pt that [facility] informed us that there was some safety issues surrounding his care & that no one was consistently with him & doing wound care & they couldn't be responsible for his care if no one was following directions.  Called [facility] & was told that pt was closed in Nov & they needed a new referral.  Message left with [facility] RN coordinator to call me back to see if we can clear some things up & get pt seen.  Received return call this pm from Stephanie/[facility]/RN stating that pt was d/c for safety reasons b/c he was gone from home for a week & unable to reach, he took 30 days of pain meds in 11 days, he was non-compliant & refused visit for recertification.  They think he needs placement or 24 hour care.  She stated that something was faxed to Dr Beryle Beams regarding this but Dr Beryle Beams doesn't remember seeing this.  Requested that they resend this.  Was informed that per management, they can't take pt back.   Discussed with Dr Beryle Beams & will ask our SW/Lauren to review & give suggestions.

## 2013-07-06 ENCOUNTER — Telehealth: Payer: Self-pay | Admitting: Dietician

## 2013-07-06 NOTE — Telephone Encounter (Signed)
Brief Outpatient Oncology Nutrition Note  Patient has been identified to be at risk on malnutrition screen.  Wt Readings from Last 10 Encounters:  06/26/13 180 lb (81.647 kg)  06/16/13 180 lb 6.4 oz (81.829 kg)  03/25/13 175 lb 8 oz (79.606 kg)  03/17/13 176 lb (79.833 kg)  03/08/13 207 lb 8 oz (94.121 kg)  03/08/13 207 lb 8 oz (94.121 kg)  03/06/13 184 lb (83.462 kg)  02/04/13 183 lb 3.2 oz (83.099 kg)  01/14/13 175 lb 1.6 oz (79.425 kg)   Dx:  Metastatic prostate cancer Called patient due to low weight.  Patient is unavailable.  Home phone has a mailbox that has not been set up and cell phone number has been changed.  Antonieta Iba, RD, LDN

## 2013-07-07 ENCOUNTER — Ambulatory Visit (HOSPITAL_COMMUNITY): Payer: Medicare Other

## 2013-07-07 ENCOUNTER — Encounter (HOSPITAL_COMMUNITY): Payer: Medicare Other

## 2013-07-09 ENCOUNTER — Other Ambulatory Visit: Payer: Self-pay | Admitting: *Deleted

## 2013-07-09 ENCOUNTER — Telehealth: Payer: Self-pay | Admitting: *Deleted

## 2013-07-09 MED ORDER — HYDROMORPHONE HCL 4 MG PO TABS: 4.0000 mg | ORAL_TABLET | ORAL | Status: DC | PRN

## 2013-07-09 MED ORDER — MORPHINE SULFATE ER 60 MG PO TBCR: 60.0000 mg | EXTENDED_RELEASE_TABLET | Freq: Three times a day (TID) | ORAL | Status: DC

## 2013-07-09 NOTE — Telephone Encounter (Signed)
Late entry: Dtr. LM on VM (on 07-08-13)  that patient needed refills of morphine and hydromorphone.  Her call back is [phone].  Polo Riley SW will try and get in touch with dtr/ and or patient to discuss this as well as some other issues.   Per Mickel Baas Jobe's note on 07/06/13 patient has a home phone where the mailbox has not been set up and his cell phone number has been changed.  This is also the situation that Ander Purpura has found in the past when she tried to contact patient.  Lauren will let us know if she is able to talk with either the patient or his dtr.

## 2013-07-09 NOTE — Telephone Encounter (Signed)
Review past narcotic rx with Dr. Beryle Beams.  Dr. Beryle Beams did new scripts  MS Contin #90- a month supply and hydromorphone 4mg  #120 - a 3 week supply.  Called patient at [phone] and let him know scripts are ready.  He will send his nephew or dtr. To pick them up.  Let them know they will need a photo ID to pick these up.

## 2013-07-13 ENCOUNTER — Encounter: Payer: Self-pay | Admitting: *Deleted

## 2013-07-13 NOTE — Progress Notes (Signed)
Clinical Social Work contacted patient by phone at patient's new home phone number, [phone].  Mr. Greenman states he is currently living with independently with support from his nephew who is a Ship broker at [employer].  Mr. Mcfarlane reports difficulty family dynamics and requests that the healthcare team contact the patient directly regarding medical care.  CSW and patient discussed importance of communicating with home health providers and ensuring a safety environment for patient and provider.  The patient requests a new home health provider to provide wound care, PT/OT, and aide to assist in bathing.  CSW will discuss with RN to place order for [facility] services.  CSW will continue to support patient as needed throughout cancer journey.  Polo Riley, MSW, LCSW, OSW-C Clinical Social Worker [facility] [phone]

## 2013-07-20 ENCOUNTER — Other Ambulatory Visit: Payer: Self-pay | Admitting: Oncology

## 2013-07-20 ENCOUNTER — Ambulatory Visit (HOSPITAL_BASED_OUTPATIENT_CLINIC_OR_DEPARTMENT_OTHER): Payer: Medicare Other

## 2013-07-20 DIAGNOSIS — C7952 Secondary malignant neoplasm of bone marrow: Secondary | ICD-10-CM

## 2013-07-20 DIAGNOSIS — C7951 Secondary malignant neoplasm of bone: Secondary | ICD-10-CM

## 2013-07-20 DIAGNOSIS — C61 Malignant neoplasm of prostate: Secondary | ICD-10-CM

## 2013-07-20 MED ORDER — GUAIFENESIN 100 MG/5ML PO SOLN: 5.0000 mL | ORAL | Status: DC | PRN

## 2013-07-20 MED ORDER — SODIUM CHLORIDE 0.9 % IV SOLN
Freq: Once | INTRAVENOUS | Status: AC
  Administered 2013-07-20: 14:00:00 via INTRAVENOUS

## 2013-07-20 MED ORDER — LIDOCAINE-PRILOCAINE 2.5-2.5 % EX CREA: TOPICAL_CREAM | CUTANEOUS | Status: DC

## 2013-07-20 MED ORDER — HEPARIN SOD (PORK) LOCK FLUSH 100 UNIT/ML IV SOLN
500.0000 [IU] | Freq: Once | INTRAVENOUS | Status: AC
  Administered 2013-07-20: 500 [IU] via INTRAVENOUS
  Filled 2013-07-20: qty 5

## 2013-07-20 MED ORDER — ZOLEDRONIC ACID 4 MG/100ML IV SOLN
4.0000 mg | Freq: Once | INTRAVENOUS | Status: AC
  Administered 2013-07-20: 4 mg via INTRAVENOUS
  Filled 2013-07-20: qty 100

## 2013-07-20 MED ORDER — SODIUM CHLORIDE 0.9 % IJ SOLN
10.0000 mL | Freq: Once | INTRAMUSCULAR | Status: AC
  Administered 2013-07-20: 10 mL via INTRAVENOUS
  Filled 2013-07-20: qty 10

## 2013-07-20 NOTE — Patient Instructions (Addendum)
Take Robitussin over the counter for cough.  Emla Cream for port-a-cath will be called to [facility] in [place].  Zoledronic Acid injection (Hypercalcemia, Oncology) What is this medicine? ZOLEDRONIC ACID (ZOE le dron ik AS id) lowers the amount of calcium loss from bone. It is used to treat too much calcium in your blood from cancer. It is also used to prevent complications of cancer that has spread to the bone. This medicine may be used for other purposes; ask your health care provider or pharmacist if you have questions. COMMON BRAND NAME(S): Zometa What should I tell my health care provider before I take this medicine? They need to know if you have any of these conditions: -aspirin-sensitive asthma -cancer, especially if you are receiving medicines used to treat cancer -dental disease or wear dentures -infection -kidney disease -receiving corticosteroids like dexamethasone or prednisone -an unusual or allergic reaction to zoledronic acid, other medicines, foods, dyes, or preservatives -pregnant or trying to get pregnant -breast-feeding How should I use this medicine? This medicine is for infusion into a vein. It is given by a health care professional in a hospital or clinic setting. Talk to your pediatrician regarding the use of this medicine in children. Special care may be needed. Overdosage: If you think you have taken too much of this medicine contact a poison control center or emergency room at once. NOTE: This medicine is only for you. Do not share this medicine with others. What if I miss a dose? It is important not to miss your dose. Call your doctor or health care professional if you are unable to keep an appointment. What may interact with this medicine? -certain antibiotics given by injection -NSAIDs, medicines for pain and inflammation, like ibuprofen or naproxen -some diuretics like bumetanide, furosemide -teriparatide -thalidomide This list may not describe all  possible interactions. Give your health care provider a list of all the medicines, herbs, non-prescription drugs, or dietary supplements you use. Also tell them if you smoke, drink alcohol, or use illegal drugs. Some items may interact with your medicine. What should I watch for while using this medicine? Visit your doctor or health care professional for regular checkups. It may be some time before you see the benefit from this medicine. Do not stop taking your medicine unless your doctor tells you to. Your doctor may order blood tests or other tests to see how you are doing. Women should inform their doctor if they wish to become pregnant or think they might be pregnant. There is a potential for serious side effects to an unborn child. Talk to your health care professional or pharmacist for more information. You should make sure that you get enough calcium and vitamin D while you are taking this medicine. Discuss the foods you eat and the vitamins you take with your health care professional. Some people who take this medicine have severe bone, joint, and/or muscle pain. This medicine may also increase your risk for jaw problems or a broken thigh bone. Tell your doctor right away if you have severe pain in your jaw, bones, joints, or muscles. Tell your doctor if you have any pain that does not go away or that gets worse. Tell your dentist and dental surgeon that you are taking this medicine. You should not have major dental surgery while on this medicine. See your dentist to have a dental exam and fix any dental problems before starting this medicine. Take good care of your teeth while on this medicine. Make sure you  see your dentist for regular follow-up appointments. What side effects may I notice from receiving this medicine? Side effects that you should report to your doctor or health care professional as soon as possible: -allergic reactions like skin rash, itching or hives, swelling of the face, lips,  or tongue -anxiety, confusion, or depression -breathing problems -changes in vision -eye pain -feeling faint or lightheaded, falls -jaw pain, especially after dental work -mouth sores -muscle cramps, stiffness, or weakness -trouble passing urine or change in the amount of urine Side effects that usually do not require medical attention (report to your doctor or health care professional if they continue or are bothersome): -bone, joint, or muscle pain -constipation -diarrhea -fever -hair loss -irritation at site where injected -loss of appetite -nausea, vomiting -stomach upset -trouble sleeping -trouble swallowing -weak or tired This list may not describe all possible side effects. Call your doctor for medical advice about side effects. You may report side effects to FDA at 1-800-FDA-1088. Where should I keep my medicine? This drug is given in a hospital or clinic and will not be stored at home. NOTE: This sheet is a summary. It may not cover all possible information. If you have questions about this medicine, talk to your doctor, pharmacist, or health care provider.  2014, Elsevier/Gold Standard. (2012-11-06 13:03:13)

## 2013-07-20 NOTE — Progress Notes (Signed)
Patient IV start unsuccessful, arms warming and he says he has a port-a-cath.  Port was placed June 26, 2013 and has not been used.  Chest xray reveals tip in SVC/RA by Dr. Barbie Banner.  Patient coughed about four times while here today for zometa.  One productive cough of white to pale yellow phlegm.  Dr. Beryle Beams notified.  Verbal order received and read back for robitussin OTC and Emla cream for future port-a-cath use.

## 2013-07-20 NOTE — Progress Notes (Signed)
Discharged at 1525 via wheelchair to lobby by [employer].  Reports his son is pickinh him up today.

## 2013-07-24 ENCOUNTER — Telehealth: Payer: Self-pay | Admitting: *Deleted

## 2013-07-24 NOTE — Telephone Encounter (Signed)
Spoke with patient.  Let him know that we had called [facility] and requested a referral for him (spoke with [facility] @ [phone]).  We requested PT,OT, aide, RN, Medical SW - assessments.   Our concerns are if patient is able to care for himself, take medications correctly, have adequate nutrition and able to live there safely.   They will go out and do an assessment..  Wanted patient to be aware that this referral is coming from our office.  He thanked me and will be expecting their call.

## 2013-07-28 ENCOUNTER — Telehealth: Payer: Self-pay | Admitting: *Deleted

## 2013-07-28 DIAGNOSIS — C61 Malignant neoplasm of prostate: Secondary | ICD-10-CM

## 2013-07-28 MED ORDER — HYDROMORPHONE HCL 4 MG PO TABS: 4.0000 mg | ORAL_TABLET | ORAL | Status: DC | PRN

## 2013-07-28 NOTE — Telephone Encounter (Signed)
Received vm call from pt requesting refill on his hydromorphone.  Informed pt that this would be ready in am.

## 2013-08-04 ENCOUNTER — Ambulatory Visit (HOSPITAL_BASED_OUTPATIENT_CLINIC_OR_DEPARTMENT_OTHER): Payer: Medicare Other

## 2013-08-04 ENCOUNTER — Other Ambulatory Visit (HOSPITAL_BASED_OUTPATIENT_CLINIC_OR_DEPARTMENT_OTHER): Payer: Medicare Other

## 2013-08-04 ENCOUNTER — Ambulatory Visit (HOSPITAL_BASED_OUTPATIENT_CLINIC_OR_DEPARTMENT_OTHER): Payer: Medicare Other | Admitting: Oncology

## 2013-08-04 VITALS — BP 108/73 | HR 87 | Temp 97.9°F | Resp 18 | Ht 77.0 in | Wt 174.8 lb

## 2013-08-04 DIAGNOSIS — G893 Neoplasm related pain (acute) (chronic): Secondary | ICD-10-CM

## 2013-08-04 DIAGNOSIS — E119 Type 2 diabetes mellitus without complications: Secondary | ICD-10-CM

## 2013-08-04 DIAGNOSIS — C7952 Secondary malignant neoplasm of bone marrow: Secondary | ICD-10-CM

## 2013-08-04 DIAGNOSIS — I10 Essential (primary) hypertension: Secondary | ICD-10-CM

## 2013-08-04 DIAGNOSIS — C61 Malignant neoplasm of prostate: Secondary | ICD-10-CM

## 2013-08-04 DIAGNOSIS — C7951 Secondary malignant neoplasm of bone: Secondary | ICD-10-CM

## 2013-08-04 DIAGNOSIS — Z452 Encounter for adjustment and management of vascular access device: Secondary | ICD-10-CM

## 2013-08-04 DIAGNOSIS — R52 Pain, unspecified: Secondary | ICD-10-CM

## 2013-08-04 LAB — CBC WITH DIFFERENTIAL/PLATELET
BASO%: 0.9 % (ref 0.0–2.0)
Basophils Absolute: 0.1 10*3/uL (ref 0.0–0.1)
EOS%: 1 % (ref 0.0–7.0)
Eosinophils Absolute: 0.1 10*3/uL (ref 0.0–0.5)
HEMATOCRIT: 40.8 % (ref 38.4–49.9)
HGB: 13.4 g/dL (ref 13.0–17.1)
LYMPH#: 1.1 10*3/uL (ref 0.9–3.3)
LYMPH%: 11.6 % — AB (ref 14.0–49.0)
MCH: 28.4 pg (ref 27.2–33.4)
MCHC: 32.8 g/dL (ref 32.0–36.0)
MCV: 86.6 fL (ref 79.3–98.0)
MONO#: 0.5 10*3/uL (ref 0.1–0.9)
MONO%: 5.8 % (ref 0.0–14.0)
NEUT#: 7.5 10*3/uL — ABNORMAL HIGH (ref 1.5–6.5)
NEUT%: 80.7 % — AB (ref 39.0–75.0)
Platelets: 183 10*3/uL (ref 140–400)
RBC: 4.72 10*6/uL (ref 4.20–5.82)
RDW: 14.6 % (ref 11.0–14.6)
WBC: 9.3 10*3/uL (ref 4.0–10.3)

## 2013-08-04 LAB — COMPREHENSIVE METABOLIC PANEL (CC13)
ALT: 8 U/L (ref 0–55)
ANION GAP: 11 meq/L (ref 3–11)
AST: 12 U/L (ref 5–34)
Albumin: 3.2 g/dL — ABNORMAL LOW (ref 3.5–5.0)
Alkaline Phosphatase: 81 U/L (ref 40–150)
BILIRUBIN TOTAL: 0.38 mg/dL (ref 0.20–1.20)
BUN: 23.1 mg/dL (ref 7.0–26.0)
CHLORIDE: 99 meq/L (ref 98–109)
CO2: 29 meq/L (ref 22–29)
CREATININE: 1.7 mg/dL — AB (ref 0.7–1.3)
Calcium: 9.1 mg/dL (ref 8.4–10.4)
Glucose: 156 mg/dl — ABNORMAL HIGH (ref 70–140)
Potassium: 3.8 mEq/L (ref 3.5–5.1)
SODIUM: 139 meq/L (ref 136–145)
Total Protein: 7.1 g/dL (ref 6.4–8.3)

## 2013-08-04 MED ORDER — SODIUM CHLORIDE 0.9 % IJ SOLN
10.0000 mL | INTRAMUSCULAR | Status: DC | PRN
  Administered 2013-08-04: 10 mL via INTRAVENOUS
  Filled 2013-08-04: qty 10

## 2013-08-04 MED ORDER — HEPARIN SOD (PORK) LOCK FLUSH 100 UNIT/ML IV SOLN
500.0000 [IU] | Freq: Once | INTRAVENOUS | Status: AC
  Administered 2013-08-04: 500 [IU] via INTRAVENOUS
  Filled 2013-08-04: qty 5

## 2013-08-04 NOTE — Patient Instructions (Signed)
We will start chemo on Tuesday March 3rd Visit with nurse practitioner & check labs on 3/17 Visit with new Oncology Dr Alen Blew on 4/6 Continue every 3 month Leupron injections Continue monthly Zometa

## 2013-08-05 ENCOUNTER — Other Ambulatory Visit: Payer: Self-pay | Admitting: *Deleted

## 2013-08-05 DIAGNOSIS — C61 Malignant neoplasm of prostate: Secondary | ICD-10-CM

## 2013-08-05 DIAGNOSIS — K257 Chronic gastric ulcer without hemorrhage or perforation: Secondary | ICD-10-CM

## 2013-08-05 LAB — PSA: PSA: 72.22 ng/mL — ABNORMAL HIGH (ref <=4.00)

## 2013-08-05 MED ORDER — PANTOPRAZOLE SODIUM 40 MG PO TBEC: 40.0000 mg | DELAYED_RELEASE_TABLET | Freq: Every day | ORAL | Status: DC

## 2013-08-05 MED ORDER — OXYCODONE-ACETAMINOPHEN 5-325 MG PO TABS: 1.0000 | ORAL_TABLET | Freq: Four times a day (QID) | ORAL | Status: DC | PRN

## 2013-08-05 MED ORDER — HYDROMORPHONE HCL 8 MG PO TABS: 8.0000 mg | ORAL_TABLET | ORAL | Status: DC | PRN

## 2013-08-05 NOTE — Progress Notes (Signed)
Hematology and Oncology Follow Up Visit  Jack Barrett [id number] 06-Apr-1946 68 y.o. 08/05/2013 7:27 AM   Principle Diagnosis: Encounter Diagnosis  Name Primary?  . Prostate cancer, primary, with metastasis from prostate to other site Yes     Interim History:   Followup visit for this  67 year old man, retired Pharmacist, hospital, with prostate cancer metastatic to bone. Initial diagnosis of a Gleason 4+5 lesion in August 2007 status post radical prostatectomy with lymph node dissection 09/04/2006. Postop radiation required for local invasion. Please see my initial office consultation dated 01/14/2013 for additional details. He had persistent elevation of his PSA post surgery and was treated with Lupron. PSA continued to rise. He initially received bicalutamide and then Enzalutamide. He had GI intolerance to the Enzalutamide which was stopped. He progressed in bone in May of 2013. He was started on a trial of Taxotere on 09/30/2012. He had poor tolerance to the drug. He was admitted to a hospital in [place] in June 2014 with failure to thrive, dehydration, nausea, vomiting, and abdominal pain. One of his daughters moved him here to [facility] secondary to concerns that his narcotic analgesics were being misused by friends and other family members.  He was extremely debilitated on arrival here. I elected to discontinue any further chemotherapy. I started him on a trial of Zytiga with low-dose prednisone in August 2014.  His performance status slowly improved. He had a setback when he developed dehiscence of the skin over the Port-A-Cath with a infection requiring hospital admission on September 28, removal of the Port-A-Cath, and parenteral antibiotics. Wound cultures grew MRSA  He was admitted to the hospital again in October secondary to deconditioning. He was requiring around-the-clock nursing care which overwhelmed his daughter. He is a diabetic. He was not eating or drinking. Once again, his situation  stabilized.  He has had issues with his pain control regimen. He was very comfortable on the regimen that I started back in August with MS Contin 60 mg every 8 hours with Dilaudid 4-8 mg every 4 hours as needed for breakthrough pain. There have been social issues with reports from advanced on care services that he was not taking his medications properly, he was confused, he was not taking care of his personal hygiene, he was left alone during the day while other family members were working. He was taken out of town for one week without giving notice to the nursing service. They discharged him  Secondary to overall noncompliance. I asked our social worker to investigate. Things seem to be back on track. One of his other daughters and a nephew are taking more responsibility for his care. He feels that he is gaining strength and one of his daughters who accompanies him today corroborates this. He is able to ambulate short distances in his apartment and is doing a minor amount of cooking.  Unfortunately, he did not respond to Zytiga/prednisone started in August 2014. PSA continued to rise. I obtained a bone scan on 06/23/2013. We do not have outside films for comparison. He appears to have limited bone metastases involving the right L1 vertebral body, possibly lower cervical spine, sternum, and focal areas of activity in the right anterior lateral fourth rib and left posterior eighth rib.   Medications: reviewed  Allergies:  Allergies  Allergen Reactions  . Aspirin Other (See Comments)    hemmoriage  . Codeine Other (See Comments)    Hallucinations; takes hydromorphone and morphine at home without issue  . Lactose Intolerance (Gi) Diarrhea  Review of Systems: Hematology:  No bleeding or bruising ENT ROS: No sore throat Breast ROS:  Respiratory ROS: No cough or dyspnea Cardiovascular ROS:  No chest pain or palpitations Gastrointestinal ROS:  Intermittent gastritis symptoms Genito-Urinary ROS:  No urinary tract symptoms Musculoskeletal ROS: Generalized pain Neurological ROS: No headache or change in vision Dermatological ROS: No rash or ecchymosis Remaining ROS negative:   Physical Exam: Blood pressure 108/73, pulse 87, temperature 97.9 F (36.6 C), temperature source Oral, resp. rate 18, height 6\' 5"  (1.956 m), weight 174 lb 12.8 oz (79.289 kg), SpO2 99.00%. Wt Readings from Last 3 Encounters:  08/04/13 174 lb 12.8 oz (79.289 kg)  06/26/13 180 lb (81.647 kg)  06/16/13 180 lb 6.4 oz (81.829 kg)     General appearance: Thin African American man HENNT: Pharynx no erythema, exudate, mass, or ulcer. No thyromegaly or thyroid nodules Lymph nodes: No cervical, supraclavicular, or axillary lymphadenopathy Breasts:  Lungs: Clear to auscultation, resonant to percussion throughout Heart: Regular rhythm, no murmur, no gallop, no rub, no click, no edema Abdomen: Soft, nontender, normal bowel sounds, no mass, no organomegaly Extremities: No edema, no calf tenderness Musculoskeletal: no joint deformities GU:  Vascular: Carotid pulses 2+, no bruits, Neurologic: Alert, oriented, PERRLA,   cranial nerves grossly normal, motor strength 5 over 5, reflexes 1+ symmetric, upper body coordination normal, gait unsteady due to generalized weakness, sensation intact to vibration by tuning fork exam over the fingertips Skin: No rash or ecchymosis  Lab Results: CBC W/Diff    Component Value Date/Time   WBC 9.3 08/04/2013 1521   WBC 6.8 06/26/2013 1313   RBC 4.72 08/04/2013 1521   RBC 4.64 06/26/2013 1313   HGB 13.4 08/04/2013 1521   HGB 12.9* 06/26/2013 1313   HCT 40.8 08/04/2013 1521   HCT 39.6 06/26/2013 1313   PLT 183 08/04/2013 1521   PLT 141* 06/26/2013 1313   MCV 86.6 08/04/2013 1521   MCV 85.3 06/26/2013 1313   MCH 28.4 08/04/2013 1521   MCH 27.8 06/26/2013 1313   MCHC 32.8 08/04/2013 1521   MCHC 32.6 06/26/2013 1313   RDW 14.6 08/04/2013 1521   RDW 13.8 06/26/2013 1313   LYMPHSABS 1.1  08/04/2013 1521   LYMPHSABS 0.9 03/17/2013 1900   MONOABS 0.5 08/04/2013 1521   MONOABS 0.6 03/17/2013 1900   EOSABS 0.1 08/04/2013 1521   EOSABS 0.0 03/17/2013 1900   BASOSABS 0.1 08/04/2013 1521   BASOSABS 0.0 03/17/2013 1900     Chemistry      Component Value Date/Time   NA 139 08/04/2013 1523   NA 139 06/26/2013 1313   K 3.8 08/04/2013 1523   K 3.9 06/26/2013 1313   CL 99 06/26/2013 1313   CO2 29 08/04/2013 1523   CO2 26 06/26/2013 1313   BUN 23.1 08/04/2013 1523   BUN 20 06/26/2013 1313   CREATININE 1.7* 08/04/2013 1523   CREATININE 1.09 06/26/2013 1313      Component Value Date/Time   CALCIUM 9.1 08/04/2013 1523   CALCIUM 9.2 06/26/2013 1313   ALKPHOS 81 08/04/2013 1523   ALKPHOS 120* 03/17/2013 1900   AST 12 08/04/2013 1523   AST 12 03/17/2013 1900   ALT 8 08/04/2013 1523   ALT 7 03/17/2013 1900   BILITOT 0.38 08/04/2013 1523   BILITOT 0.3 03/17/2013 1900       Radiological Studies: No results found.  Impression:  #1. 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 Weight has  been fluctuating between 175 and 180 pounds. Subjectively, he feels he has made progress. He is much better than when he arrived in [place] back in August. We discussed the risks versus benefits of resuming a chemotherapy program. I told him that I still have concerns that the toxicities might be higher than the benefits. He is very lucid today. He understands the risks. He still wants to proceed with a trial of chemotherapy and understands that if the toxicities are higher than the benefits that he would abandon additional treatments. I'm going to start him out with single agent Taxotere at a 30% dose reduction with total dose 50 mg per meter squared every 3 weeks. Although he might tolerate a weekly low-dose schedule better, I think it will be difficult for him to get to our office very frequently secondary to his overall performance status and transportation  issues. I did have a Port-A-Cath infusion device placed. He'll continue every three-month Lupron injections and monthly Zometa as well as the chemotherapy. Target date to start the chemotherapy is Tuesday, March 3.  I have scheduled   a nurse practitioner visit at his nadir from the first cycle on March 17 to assess toxicities and subsequently I will transition his care to Dr. Alen Blew  #2. Pain control issues He asked that I increase the strength of the Dilaudid tablet to 8 mg so that he only has to take one pill at a time instead of 2. The every 4 hours schedule for breakthrough is working well for him. He also requests that we continue his Percocet for minor breakthrough pain. He will also continue OxyContin 60 mg every 8 hours.  #3. Essential hypertension on Norvasc and hydralazine  #4. Insulin-dependent diabetes He was able to establish with a primary care physician to assist with management of his hypertension and diabetes.  #5. Social issues Some of these seem to have resolved. I also ask our social work team to monitor the situation.   CC: Patient Care Team: Dr. Rusty Aus  Annia Belt, MD 2/25/20157:27 AM

## 2013-08-05 NOTE — Telephone Encounter (Signed)
Spoke with patient.  Let him know that protonix was called in to the [facility]  Also they have the dilaudid in 8mg  tablets.  That script, along with the script for percocet is ready for him to pick up from the injection nurse.  Also let him know that scheduler would be calling him for chemo education class.  He has already had the taxotere in [place].  Let him know that our chemo education would be beneficial to help him navigate through chemotherapy at our cancer center.  It would let him know what resources are available here.  He has agreed to come to an education class.

## 2013-08-06 ENCOUNTER — Telehealth: Payer: Self-pay | Admitting: *Deleted

## 2013-08-06 ENCOUNTER — Telehealth: Payer: Self-pay | Admitting: Oncology

## 2013-08-06 NOTE — Telephone Encounter (Signed)
, °

## 2013-08-06 NOTE — Telephone Encounter (Signed)
Per staff message and POF I have scheduled appts.  JMW  

## 2013-08-10 ENCOUNTER — Other Ambulatory Visit: Payer: Self-pay | Admitting: Oncology

## 2013-08-11 ENCOUNTER — Ambulatory Visit (HOSPITAL_BASED_OUTPATIENT_CLINIC_OR_DEPARTMENT_OTHER): Payer: Medicare Other

## 2013-08-11 ENCOUNTER — Other Ambulatory Visit (HOSPITAL_BASED_OUTPATIENT_CLINIC_OR_DEPARTMENT_OTHER): Payer: Medicare Other

## 2013-08-11 ENCOUNTER — Other Ambulatory Visit: Payer: Self-pay | Admitting: *Deleted

## 2013-08-11 ENCOUNTER — Other Ambulatory Visit: Payer: Medicare Other

## 2013-08-11 ENCOUNTER — Encounter: Payer: Self-pay | Admitting: *Deleted

## 2013-08-11 VITALS — BP 145/67 | HR 59 | Temp 97.8°F | Resp 20

## 2013-08-11 DIAGNOSIS — C61 Malignant neoplasm of prostate: Secondary | ICD-10-CM

## 2013-08-11 DIAGNOSIS — Z5111 Encounter for antineoplastic chemotherapy: Secondary | ICD-10-CM

## 2013-08-11 DIAGNOSIS — C7951 Secondary malignant neoplasm of bone: Secondary | ICD-10-CM

## 2013-08-11 DIAGNOSIS — G893 Neoplasm related pain (acute) (chronic): Secondary | ICD-10-CM

## 2013-08-11 DIAGNOSIS — K257 Chronic gastric ulcer without hemorrhage or perforation: Secondary | ICD-10-CM

## 2013-08-11 DIAGNOSIS — C7952 Secondary malignant neoplasm of bone marrow: Secondary | ICD-10-CM

## 2013-08-11 LAB — CBC WITH DIFFERENTIAL/PLATELET
BASO%: 0.4 % (ref 0.0–2.0)
BASOS ABS: 0 10*3/uL (ref 0.0–0.1)
EOS ABS: 0.1 10*3/uL (ref 0.0–0.5)
EOS%: 1.7 % (ref 0.0–7.0)
HCT: 40.2 % (ref 38.4–49.9)
HEMOGLOBIN: 13.2 g/dL (ref 13.0–17.1)
LYMPH%: 18.2 % (ref 14.0–49.0)
MCH: 27.8 pg (ref 27.2–33.4)
MCHC: 32.8 g/dL (ref 32.0–36.0)
MCV: 84.8 fL (ref 79.3–98.0)
MONO#: 0.6 10*3/uL (ref 0.1–0.9)
MONO%: 7.9 % (ref 0.0–14.0)
NEUT%: 71.8 % (ref 39.0–75.0)
NEUTROS ABS: 5.1 10*3/uL (ref 1.5–6.5)
PLATELETS: 166 10*3/uL (ref 140–400)
RBC: 4.74 10*6/uL (ref 4.20–5.82)
RDW: 14.8 % — ABNORMAL HIGH (ref 11.0–14.6)
WBC: 7.1 10*3/uL (ref 4.0–10.3)
lymph#: 1.3 10*3/uL (ref 0.9–3.3)

## 2013-08-11 MED ORDER — SODIUM CHLORIDE 0.9 % IJ SOLN
10.0000 mL | INTRAMUSCULAR | Status: DC | PRN
  Administered 2013-08-11: 10 mL
  Filled 2013-08-11: qty 10

## 2013-08-11 MED ORDER — ONDANSETRON 8 MG/NS 50 ML IVPB
INTRAVENOUS | Status: AC
  Filled 2013-08-11: qty 8

## 2013-08-11 MED ORDER — PANTOPRAZOLE SODIUM 40 MG PO TBEC: 40.0000 mg | DELAYED_RELEASE_TABLET | Freq: Every day | ORAL | Status: DC

## 2013-08-11 MED ORDER — ZOLEDRONIC ACID 4 MG/100ML IV SOLN
4.0000 mg | Freq: Once | INTRAVENOUS | Status: AC
  Administered 2013-08-11: 4 mg via INTRAVENOUS
  Filled 2013-08-11: qty 100

## 2013-08-11 MED ORDER — ONDANSETRON HCL 8 MG PO TABS: 8.0000 mg | ORAL_TABLET | Freq: Two times a day (BID) | ORAL | Status: DC

## 2013-08-11 MED ORDER — HEPARIN SOD (PORK) LOCK FLUSH 100 UNIT/ML IV SOLN
500.0000 [IU] | Freq: Once | INTRAVENOUS | Status: AC | PRN
  Administered 2013-08-11: 500 [IU]
  Filled 2013-08-11: qty 5

## 2013-08-11 MED ORDER — SODIUM CHLORIDE 0.9 % IV SOLN
50.0000 mg/m2 | Freq: Once | INTRAVENOUS | Status: AC
  Administered 2013-08-11: 100 mg via INTRAVENOUS
  Filled 2013-08-11: qty 10

## 2013-08-11 MED ORDER — DEXAMETHASONE 4 MG PO TABS: 8.0000 mg | ORAL_TABLET | Freq: Two times a day (BID) | ORAL | Status: DC

## 2013-08-11 MED ORDER — DEXAMETHASONE SODIUM PHOSPHATE 10 MG/ML IJ SOLN
INTRAMUSCULAR | Status: AC
  Filled 2013-08-11: qty 1

## 2013-08-11 MED ORDER — ONDANSETRON 8 MG PO TBDP: 8.0000 mg | ORAL_TABLET | Freq: Three times a day (TID) | ORAL | Status: DC | PRN

## 2013-08-11 MED ORDER — MORPHINE SULFATE ER 60 MG PO TBCR: 60.0000 mg | EXTENDED_RELEASE_TABLET | Freq: Three times a day (TID) | ORAL | Status: DC

## 2013-08-11 MED ORDER — DEXAMETHASONE SODIUM PHOSPHATE 10 MG/ML IJ SOLN
10.0000 mg | Freq: Once | INTRAMUSCULAR | Status: AC
  Administered 2013-08-11: 10 mg via INTRAVENOUS

## 2013-08-11 MED ORDER — SODIUM CHLORIDE 0.9 % IV SOLN
Freq: Once | INTRAVENOUS | Status: AC
  Administered 2013-08-11: 11:00:00 via INTRAVENOUS

## 2013-08-11 MED ORDER — ONDANSETRON 8 MG/50ML IVPB (CHCC)
8.0000 mg | Freq: Once | INTRAVENOUS | Status: AC
  Administered 2013-08-11: 8 mg via INTRAVENOUS

## 2013-08-11 NOTE — Telephone Encounter (Signed)
Scripts were sent to [facility] & pt's daughter states they need to go to [place].  This was changed.

## 2013-08-11 NOTE — Progress Notes (Signed)
While in infusion room, daughter states the pt needs his rx for ondansetron and decadron, reporting he took prednisone today at home but not decadron. Pt later asks other RN for a refill of his morphine. This RN communicated to [place], Automotive engineer for Dr. Beryle Beams to please help clarify pt's home medication regimen. Myrtle, RN reports she will follow up with patient. This RN reviewed pt's phone numbers and the 2 numbers listed in "snap shot" are working numbers that he will answer.

## 2013-08-11 NOTE — Patient Instructions (Signed)
[facility] Discharge Instructions for Patients Receiving ChemotherapyZoledronic Acid injection (Hypercalcemia, Oncology) What is this medicine? ZOLEDRONIC ACID (ZOE le dron ik AS id) lowers the amount of calcium loss from bone. It is used to treat too much calcium in your blood from cancer. It is also used to prevent complications of cancer that has spread to the bone. This medicine may be used for other purposes; ask your health care provider or pharmacist if you have questions. COMMON BRAND NAME(S): Zometa What should I tell my health care provider before I take this medicine? They need to know if you have any of these conditions: -aspirin-sensitive asthma -cancer, especially if you are receiving medicines used to treat cancer -dental disease or wear dentures -infection -kidney disease -receiving corticosteroids like dexamethasone or prednisone -an unusual or allergic reaction to zoledronic acid, other medicines, foods, dyes, or preservatives -pregnant or trying to get pregnant -breast-feeding How should I use this medicine? This medicine is for infusion into a vein. It is given by a health care professional in a hospital or clinic setting. Talk to your pediatrician regarding the use of this medicine in children. Special care may be needed. Overdosage: If you think you have taken too much of this medicine contact a poison control center or emergency room at once. NOTE: This medicine is only for you. Do not share this medicine with others. What if I miss a dose? It is important not to miss your dose. Call your doctor or health care professional if you are unable to keep an appointment. What may interact with this medicine? -certain antibiotics given by injection -NSAIDs, medicines for pain and inflammation, like ibuprofen or naproxen -some diuretics like bumetanide, furosemide -teriparatide -thalidomide This list may not describe all possible interactions. Give your  health care provider a list of all the medicines, herbs, non-prescription drugs, or dietary supplements you use. Also tell them if you smoke, drink alcohol, or use illegal drugs. Some items may interact with your medicine. What should I watch for while using this medicine? Visit your doctor or health care professional for regular checkups. It may be some time before you see the benefit from this medicine. Do not stop taking your medicine unless your doctor tells you to. Your doctor may order blood tests or other tests to see how you are doing. Women should inform their doctor if they wish to become pregnant or think they might be pregnant. There is a potential for serious side effects to an unborn child. Talk to your health care professional or pharmacist for more information. You should make sure that you get enough calcium and vitamin D while you are taking this medicine. Discuss the foods you eat and the vitamins you take with your health care professional. Some people who take this medicine have severe bone, joint, and/or muscle pain. This medicine may also increase your risk for jaw problems or a broken thigh bone. Tell your doctor right away if you have severe pain in your jaw, bones, joints, or muscles. Tell your doctor if you have any pain that does not go away or that gets worse. Tell your dentist and dental surgeon that you are taking this medicine. You should not have major dental surgery while on this medicine. See your dentist to have a dental exam and fix any dental problems before starting this medicine. Take good care of your teeth while on this medicine. Make sure you see your dentist for regular follow-up appointments. What side effects may  I notice from receiving this medicine? Side effects that you should report to your doctor or health care professional as soon as possible: -allergic reactions like skin rash, itching or hives, swelling of the face, lips, or tongue -anxiety, confusion,  or depression -breathing problems -changes in vision -eye pain -feeling faint or lightheaded, falls -jaw pain, especially after dental work -mouth sores -muscle cramps, stiffness, or weakness -trouble passing urine or change in the amount of urine Side effects that usually do not require medical attention (report to your doctor or health care professional if they continue or are bothersome): -bone, joint, or muscle pain -constipation -diarrhea -fever -hair loss -irritation at site where injected -loss of appetite -nausea, vomiting -stomach upset -trouble sleeping -trouble swallowing -weak or tired This list may not describe all possible side effects. Call your doctor for medical advice about side effects. You may report side effects to FDA at 1-800-FDA-1088. Where should I keep my medicine? This drug is given in a hospital or clinic and will not be stored at home. NOTE: This sheet is a summary. It may not cover all possible information. If you have questions about this medicine, talk to your doctor, pharmacist, or health care provider.  2014, Elsevier/Gold Standard. (2012-11-06 13:03:13)   Today you received the following chemotherapy agent: Taxotere   To help prevent nausea and vomiting after your treatment, we encourage you to take your nausea medication as prescribed.    If you develop nausea and vomiting that is not controlled by your nausea medication, call the clinic.   BELOW ARE SYMPTOMS THAT SHOULD BE REPORTED IMMEDIATELY:  *FEVER GREATER THAN 100.5 F  *CHILLS WITH OR WITHOUT FEVER  NAUSEA AND VOMITING THAT IS NOT CONTROLLED WITH YOUR NAUSEA MEDICATION  *UNUSUAL SHORTNESS OF BREATH  *UNUSUAL BRUISING OR BLEEDING  TENDERNESS IN MOUTH AND THROAT WITH OR WITHOUT PRESENCE OF ULCERS  *URINARY PROBLEMS  *BOWEL PROBLEMS  UNUSUAL RASH Items with * indicate a potential emergency and should be followed up as soon as possible.  Feel free to call the clinic you  have any questions or concerns. The clinic phone number is [phone].

## 2013-08-12 ENCOUNTER — Telehealth: Payer: Self-pay | Admitting: *Deleted

## 2013-08-12 DIAGNOSIS — G893 Neoplasm related pain (acute) (chronic): Secondary | ICD-10-CM

## 2013-08-12 DIAGNOSIS — C61 Malignant neoplasm of prostate: Secondary | ICD-10-CM

## 2013-08-12 MED ORDER — ONDANSETRON HCL 8 MG PO TABS: 8.0000 mg | ORAL_TABLET | Freq: Two times a day (BID) | ORAL | Status: DC

## 2013-08-12 NOTE — Telephone Encounter (Signed)
Spoke with patient regarding his chemo treatment. Denies any N/V-does not have his Zofran yet (waiting for it to be called in). Made him aware it was sent to [facility] yesterday and his MS Contin script is ready for pick up here. He requests nurse contact his daughter. Reports his urine is dark and has strong odor-made him aware he is not drinking enough fluid and needs to drink #8  8 oz glasses of fluids daily. He is also constipated-Does not like MiraLax-prefers MOM. His brother brought him some docusate sodium and he is going to take one soon. Suggested he take #2 today and that he can increase to #2 bid. Made him aware he will probably need to take this every day since he is on narcotics. He expressed displeasure of tx area being so large, with so many people and that he prefers to keep his curtain closed at all times. Spoke with daughter, Nicolette: She will pick up his MS Contin script with revised calendar since he no longer requires the 3/9 infusion of Zometa (got it yesterday). [facility] and needs to [facility] to [facility] on [address]. Gave her instructions on how to take the antiemetic and that he needs to start this today-taking twice daily X 2 days, then as needed. Also has ODT if he can't keep po tablet down. Called [facility] to [facility] sent yesterday.

## 2013-08-17 ENCOUNTER — Ambulatory Visit: Payer: Medicare Other

## 2013-08-25 ENCOUNTER — Telehealth: Payer: Self-pay | Admitting: Oncology

## 2013-08-25 ENCOUNTER — Other Ambulatory Visit (HOSPITAL_BASED_OUTPATIENT_CLINIC_OR_DEPARTMENT_OTHER): Payer: Medicare Other

## 2013-08-25 ENCOUNTER — Ambulatory Visit (HOSPITAL_BASED_OUTPATIENT_CLINIC_OR_DEPARTMENT_OTHER): Payer: Medicare Other | Admitting: Nurse Practitioner

## 2013-08-25 VITALS — BP 121/73 | HR 77 | Temp 98.3°F | Resp 18 | Ht 77.0 in | Wt 175.0 lb

## 2013-08-25 DIAGNOSIS — C7951 Secondary malignant neoplasm of bone: Secondary | ICD-10-CM

## 2013-08-25 DIAGNOSIS — C61 Malignant neoplasm of prostate: Secondary | ICD-10-CM

## 2013-08-25 DIAGNOSIS — G893 Neoplasm related pain (acute) (chronic): Secondary | ICD-10-CM

## 2013-08-25 DIAGNOSIS — C7952 Secondary malignant neoplasm of bone marrow: Secondary | ICD-10-CM

## 2013-08-25 LAB — COMPREHENSIVE METABOLIC PANEL (CC13)
ALBUMIN: 3.2 g/dL — AB (ref 3.5–5.0)
ALT: 8 U/L (ref 0–55)
AST: 12 U/L (ref 5–34)
Alkaline Phosphatase: 79 U/L (ref 40–150)
Anion Gap: 13 mEq/L — ABNORMAL HIGH (ref 3–11)
BUN: 20.2 mg/dL (ref 7.0–26.0)
CALCIUM: 9.4 mg/dL (ref 8.4–10.4)
CHLORIDE: 99 meq/L (ref 98–109)
CO2: 26 mEq/L (ref 22–29)
Creatinine: 1.9 mg/dL — ABNORMAL HIGH (ref 0.7–1.3)
GLUCOSE: 103 mg/dL (ref 70–140)
POTASSIUM: 3.9 meq/L (ref 3.5–5.1)
Sodium: 138 mEq/L (ref 136–145)
Total Bilirubin: 0.2 mg/dL (ref 0.20–1.20)
Total Protein: 6.9 g/dL (ref 6.4–8.3)

## 2013-08-25 LAB — CBC WITH DIFFERENTIAL/PLATELET
BASO%: 0.8 % (ref 0.0–2.0)
BASOS ABS: 0 10*3/uL (ref 0.0–0.1)
EOS ABS: 0 10*3/uL (ref 0.0–0.5)
EOS%: 0.9 % (ref 0.0–7.0)
HCT: 40.8 % (ref 38.4–49.9)
HEMOGLOBIN: 13.4 g/dL (ref 13.0–17.1)
LYMPH#: 1.6 10*3/uL (ref 0.9–3.3)
LYMPH%: 36.9 % (ref 14.0–49.0)
MCH: 28.1 pg (ref 27.2–33.4)
MCHC: 32.7 g/dL (ref 32.0–36.0)
MCV: 85.7 fL (ref 79.3–98.0)
MONO#: 0.8 10*3/uL (ref 0.1–0.9)
MONO%: 17.9 % — ABNORMAL HIGH (ref 0.0–14.0)
NEUT#: 1.9 10*3/uL (ref 1.5–6.5)
NEUT%: 43.5 % (ref 39.0–75.0)
Platelets: 163 10*3/uL (ref 140–400)
RBC: 4.76 10*6/uL (ref 4.20–5.82)
RDW: 14.5 % (ref 11.0–14.6)
WBC: 4.4 10*3/uL (ref 4.0–10.3)

## 2013-08-25 MED ORDER — OXYCODONE-ACETAMINOPHEN 5-325 MG PO TABS: 1.0000 | ORAL_TABLET | Freq: Four times a day (QID) | ORAL | Status: DC | PRN

## 2013-08-25 MED ORDER — HYDROMORPHONE HCL 8 MG PO TABS: 8.0000 mg | ORAL_TABLET | ORAL | Status: DC | PRN

## 2013-08-25 NOTE — Telephone Encounter (Signed)
Gave pt appt for lab,md and chemo for March and April 2015 °

## 2013-08-25 NOTE — Progress Notes (Signed)
OFFICE PROGRESS NOTE  Interval history:  Jack Barrett is a 68 year old man with prostate cancer metastatic to bone. Initial diagnosis dates to August 2007, Gleason 4+5 lesion status post radical prostatectomy with lymph node dissection 09/04/2006. Postop radiation required for local invasion. Persistent elevation of the PSA following surgery treated with Lupron. PSA continued to rise. He initially received Casodex and then enzalutamide. Enzalutamide was stopped for GI intolerance. Progression in bone in May 2013. He was started on a trial of Taxotere 09/30/2012 with poor tolerance. He was admitted to a hospital in [facility] June 2014 with failure to thrive, dehydration, nausea, vomiting and abdominal pain. He subsequently relocated to [place] with family. He was started on a trial of abiraterone with low-dose prednisone August 2014. He was hospitalized September 2014 with a Port-A-Cath infection. Wound cultures grew MRSA. He was readmitted in October 2014 secondary to deconditioning. He did not respond to the abiraterone/prednisone with a continued rise in the PSA. Bone scan 06/23/2013 showed focal area of increased activity in the right anterior lateral fourth rib, left posterior eighth rib, sternum, right L1 vertebral body, possibly lower cervical spine.  Taxotere was reinitiated at 50 mg per meter squared on a 3 week schedule on 08/11/2013. Lupron continued on an every 3 month schedule and Zometa every 4 weeks.  He reports chronic intermittent nausea/vomiting which worsened following the chemotherapy. He denies mouth sores. No diarrhea or constipation. No change in baseline numbness/tingling in the hands and feet which he relates to "diabetic neuropathy". He feels his pain is managed well with a combination of Dilaudid and Percocet. He reports an overall good appetite. He continues to have hot flashes.    Objective: Filed Vitals:   08/25/13 1200  BP: 121/73  Pulse: 77  Temp: 98.3 F (36.8 C)   Resp: 18   Oropharynx is without thrush or ulceration. Lungs are clear. No wheezes or rales. Regular cardiac rhythm. Port-A-Cath site is without erythema. Abdomen soft and nontender. No hepatomegaly. No leg edema. Motor strength 5 over 5. Knee DTRs 1+, symmetric. Vibratory sense mildly to moderately decreased over the fingertips per tuning fork exam.   Lab Results: Lab Results  Component Value Date   WBC 4.4 08/25/2013   HGB 13.4 08/25/2013   HCT 40.8 08/25/2013   MCV 85.7 08/25/2013   PLT 163 08/25/2013   NEUTROABS 1.9 08/25/2013    Chemistry:    Chemistry      Component Value Date/Time   NA 138 08/25/2013 1129   NA 139 06/26/2013 1313   K 3.9 08/25/2013 1129   K 3.9 06/26/2013 1313   CL 99 06/26/2013 1313   CO2 26 08/25/2013 1129   CO2 26 06/26/2013 1313   BUN 20.2 08/25/2013 1129   BUN 20 06/26/2013 1313   CREATININE 1.9* 08/25/2013 1129   CREATININE 1.09 06/26/2013 1313      Component Value Date/Time   CALCIUM 9.4 08/25/2013 1129   CALCIUM 9.2 06/26/2013 1313   ALKPHOS 79 08/25/2013 1129   ALKPHOS 120* 03/17/2013 1900   AST 12 08/25/2013 1129   AST 12 03/17/2013 1900   ALT 8 08/25/2013 1129   ALT 7 03/17/2013 1900   BILITOT 0.20 08/25/2013 1129   BILITOT 0.3 03/17/2013 1900       Studies/Results: No results found.  Medications: I have reviewed the patient's current medications.  Assessment/Plan: 1. 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 Last Lupron injection (every 3 month schedule) given 06/26/2013, last Zometa  infusion 08/11/2013. 2. Pain secondary to #1. He reports good pain control with a combination of Dilaudid and Percocet. New prescriptions were given at today's visit for both. 3. Essential hypertension. 4. Insulin-dependent diabetes. 5. Diabetic peripheral neuropathy.   Dispositon-he appears stable. Plan to proceed with cycle 2 Taxotere as scheduled on 09/01/2013. His care is being transitioned to Dr. Alen Blew. He  will return for a followup visit with Dr. Alen Blew on 09/16/2013.  Plan per Dr. Beryle Beams.   Jack Barrett ANP/GNP-BC

## 2013-08-26 ENCOUNTER — Other Ambulatory Visit: Payer: Self-pay | Admitting: Oncology

## 2013-08-26 DIAGNOSIS — C61 Malignant neoplasm of prostate: Secondary | ICD-10-CM

## 2013-08-26 DIAGNOSIS — N179 Acute kidney failure, unspecified: Secondary | ICD-10-CM

## 2013-08-28 ENCOUNTER — Telehealth: Payer: Self-pay | Admitting: Oncology

## 2013-08-28 ENCOUNTER — Ambulatory Visit (HOSPITAL_COMMUNITY): Payer: Medicare Other

## 2013-08-28 ENCOUNTER — Telehealth: Payer: Self-pay | Admitting: Nurse Practitioner

## 2013-08-28 ENCOUNTER — Other Ambulatory Visit: Payer: Self-pay | Admitting: Nurse Practitioner

## 2013-08-28 NOTE — Telephone Encounter (Signed)
s.w. pt and gv him radiology # to r/s appt to preferred d.t

## 2013-08-28 NOTE — Telephone Encounter (Signed)
I called Jack Barrett to make him aware of the elevated creatinine and that Dr. Beryle Beams would like for him to have a renal ultrasound. The ultrasound is scheduled later today. He is unable to come today and would like to reschedule to next week. A POF was forwarded to scheduling.

## 2013-08-31 ENCOUNTER — Other Ambulatory Visit: Payer: Self-pay | Admitting: Oncology

## 2013-08-31 ENCOUNTER — Ambulatory Visit (HOSPITAL_COMMUNITY)
Admission: RE | Admit: 2013-08-31 | Discharge: 2013-08-31 | Disposition: A | Discharge status: Home / Self Care | Payer: Medicare Other | Source: Ambulatory Visit | Attending: Oncology | Admitting: Oncology

## 2013-08-31 DIAGNOSIS — R7989 Other specified abnormal findings of blood chemistry: Secondary | ICD-10-CM | POA: Insufficient documentation

## 2013-08-31 DIAGNOSIS — C61 Malignant neoplasm of prostate: Secondary | ICD-10-CM | POA: Insufficient documentation

## 2013-08-31 DIAGNOSIS — N179 Acute kidney failure, unspecified: Secondary | ICD-10-CM

## 2013-08-31 DIAGNOSIS — N19 Unspecified kidney failure: Secondary | ICD-10-CM | POA: Insufficient documentation

## 2013-09-01 ENCOUNTER — Ambulatory Visit (HOSPITAL_BASED_OUTPATIENT_CLINIC_OR_DEPARTMENT_OTHER): Payer: Medicare Other

## 2013-09-01 ENCOUNTER — Other Ambulatory Visit (HOSPITAL_BASED_OUTPATIENT_CLINIC_OR_DEPARTMENT_OTHER): Payer: Medicare Other

## 2013-09-01 VITALS — BP 108/81 | HR 67 | Temp 97.8°F | Resp 18

## 2013-09-01 DIAGNOSIS — C7952 Secondary malignant neoplasm of bone marrow: Secondary | ICD-10-CM

## 2013-09-01 DIAGNOSIS — C7951 Secondary malignant neoplasm of bone: Secondary | ICD-10-CM

## 2013-09-01 DIAGNOSIS — Z5111 Encounter for antineoplastic chemotherapy: Secondary | ICD-10-CM

## 2013-09-01 DIAGNOSIS — C61 Malignant neoplasm of prostate: Secondary | ICD-10-CM

## 2013-09-01 DIAGNOSIS — G893 Neoplasm related pain (acute) (chronic): Secondary | ICD-10-CM

## 2013-09-01 LAB — CBC WITH DIFFERENTIAL/PLATELET
BASO%: 0.3 % (ref 0.0–2.0)
Basophils Absolute: 0 10*3/uL (ref 0.0–0.1)
EOS%: 0.6 % (ref 0.0–7.0)
Eosinophils Absolute: 0.1 10*3/uL (ref 0.0–0.5)
HCT: 38.6 % (ref 38.4–49.9)
HGB: 13 g/dL (ref 13.0–17.1)
LYMPH%: 17.3 % (ref 14.0–49.0)
MCH: 28.1 pg (ref 27.2–33.4)
MCHC: 33.7 g/dL (ref 32.0–36.0)
MCV: 83.5 fL (ref 79.3–98.0)
MONO#: 0.8 10*3/uL (ref 0.1–0.9)
MONO%: 9 % (ref 0.0–14.0)
NEUT#: 6.7 10*3/uL — ABNORMAL HIGH (ref 1.5–6.5)
NEUT%: 72.8 % (ref 39.0–75.0)
NRBC: 0 % (ref 0–0)
PLATELETS: 161 10*3/uL (ref 140–400)
RBC: 4.62 10*6/uL (ref 4.20–5.82)
RDW: 14.7 % — AB (ref 11.0–14.6)
WBC: 9.3 10*3/uL (ref 4.0–10.3)
lymph#: 1.6 10*3/uL (ref 0.9–3.3)

## 2013-09-01 LAB — COMPREHENSIVE METABOLIC PANEL (CC13)
ALK PHOS: 82 U/L (ref 40–150)
ANION GAP: 10 meq/L (ref 3–11)
AST: 11 U/L (ref 5–34)
Albumin: 2.6 g/dL — ABNORMAL LOW (ref 3.5–5.0)
BILIRUBIN TOTAL: 0.36 mg/dL (ref 0.20–1.20)
BUN: 14.7 mg/dL (ref 7.0–26.0)
CO2: 25 meq/L (ref 22–29)
CREATININE: 1.6 mg/dL — AB (ref 0.7–1.3)
Calcium: 9 mg/dL (ref 8.4–10.4)
Chloride: 101 mEq/L (ref 98–109)
Glucose: 102 mg/dl (ref 70–140)
Potassium: 4 mEq/L (ref 3.5–5.1)
SODIUM: 135 meq/L — AB (ref 136–145)
Total Protein: 6.6 g/dL (ref 6.4–8.3)

## 2013-09-01 MED ORDER — DEXAMETHASONE SODIUM PHOSPHATE 10 MG/ML IJ SOLN
INTRAMUSCULAR | Status: AC
  Filled 2013-09-01: qty 1

## 2013-09-01 MED ORDER — SODIUM CHLORIDE 0.9 % IV SOLN
Freq: Once | INTRAVENOUS | Status: AC
  Administered 2013-09-01: 14:00:00 via INTRAVENOUS

## 2013-09-01 MED ORDER — SODIUM CHLORIDE 0.9 % IV SOLN
50.0000 mg/m2 | Freq: Once | INTRAVENOUS | Status: AC
  Administered 2013-09-01: 100 mg via INTRAVENOUS
  Filled 2013-09-01: qty 10

## 2013-09-01 MED ORDER — HEPARIN SOD (PORK) LOCK FLUSH 100 UNIT/ML IV SOLN
500.0000 [IU] | Freq: Once | INTRAVENOUS | Status: AC | PRN
  Administered 2013-09-01: 500 [IU]
  Filled 2013-09-01: qty 5

## 2013-09-01 MED ORDER — ONDANSETRON 8 MG/NS 50 ML IVPB
INTRAVENOUS | Status: AC
  Filled 2013-09-01: qty 8

## 2013-09-01 MED ORDER — ONDANSETRON 8 MG/50ML IVPB (CHCC)
8.0000 mg | Freq: Once | INTRAVENOUS | Status: AC
  Administered 2013-09-01: 8 mg via INTRAVENOUS

## 2013-09-01 MED ORDER — DEXAMETHASONE SODIUM PHOSPHATE 10 MG/ML IJ SOLN
10.0000 mg | Freq: Once | INTRAMUSCULAR | Status: AC
  Administered 2013-09-01: 10 mg via INTRAVENOUS

## 2013-09-01 MED ORDER — SODIUM CHLORIDE 0.9 % IJ SOLN
10.0000 mL | INTRAMUSCULAR | Status: DC | PRN
  Administered 2013-09-01: 10 mL
  Filled 2013-09-01: qty 10

## 2013-09-01 NOTE — Patient Instructions (Signed)
[facility] Discharge Instructions for Patients Receiving Chemotherapy  Today you received the following chemotherapy agents:  Taxotere  To help prevent nausea and vomiting after your treatment, we encourage you to take your nausea medication as ordered per MD.   If you develop nausea and vomiting that is not controlled by your nausea medication, call the clinic.   BELOW ARE SYMPTOMS THAT SHOULD BE REPORTED IMMEDIATELY:  *FEVER GREATER THAN 100.5 F  *CHILLS WITH OR WITHOUT FEVER  NAUSEA AND VOMITING THAT IS NOT CONTROLLED WITH YOUR NAUSEA MEDICATION  *UNUSUAL SHORTNESS OF BREATH  *UNUSUAL BRUISING OR BLEEDING  TENDERNESS IN MOUTH AND THROAT WITH OR WITHOUT PRESENCE OF ULCERS  *URINARY PROBLEMS  *BOWEL PROBLEMS  UNUSUAL RASH Items with * indicate a potential emergency and should be followed up as soon as possible.  Feel free to call the clinic you have any questions or concerns. The clinic phone number is [phone].    

## 2013-09-02 ENCOUNTER — Telehealth: Payer: Self-pay | Admitting: *Deleted

## 2013-09-02 NOTE — Telephone Encounter (Signed)
Message copied by Domenic Schwab on Wed Sep 02, 2013  9:49 AM ------      Message from: Annia Belt      Created: Mon Aug 31, 2013  7:58 PM       Call pt:  Ultrasound of kidneys show no kidney stones or sign of blockage ------

## 2013-09-02 NOTE — Telephone Encounter (Signed)
Per Dr. Beryle Beams; notified pt that ultrasound of kidneys show no kidney stones or sign of blockage.  Pt verbalized understanding states "some good news; that's great"  Confirmed appt with Dr. Alen Blew on 09/16/13.

## 2013-09-11 ENCOUNTER — Telehealth: Payer: Self-pay | Admitting: *Deleted

## 2013-09-11 DIAGNOSIS — C61 Malignant neoplasm of prostate: Secondary | ICD-10-CM

## 2013-09-11 MED ORDER — MORPHINE SULFATE ER 60 MG PO TBCR: 60.0000 mg | EXTENDED_RELEASE_TABLET | Freq: Three times a day (TID) | ORAL | Status: DC

## 2013-09-11 NOTE — Telephone Encounter (Signed)
Patient called requesting refill of his morphine sulfate 60mg  (his MS Contin).  He takes 60mg  three times a day and got a script for #90 on 08-11-13.  He is transitioning to Dr. Alen Blew and has an appt. To see he on 09/16/13.  Will discuss refill request with Dr. Alen Blew.

## 2013-09-11 NOTE — Telephone Encounter (Signed)
Called patient and LM on home phone that prescription was ready for pickup.  (Note: dtr, Nicollette also called and left message that we should call her about prescription, however per patient request - I only left message with him)

## 2013-09-16 ENCOUNTER — Telehealth: Payer: Self-pay | Admitting: *Deleted

## 2013-09-16 ENCOUNTER — Encounter: Payer: Self-pay | Admitting: Oncology

## 2013-09-16 ENCOUNTER — Ambulatory Visit (HOSPITAL_BASED_OUTPATIENT_CLINIC_OR_DEPARTMENT_OTHER): Payer: Medicare Other | Admitting: Oncology

## 2013-09-16 ENCOUNTER — Other Ambulatory Visit (HOSPITAL_BASED_OUTPATIENT_CLINIC_OR_DEPARTMENT_OTHER): Payer: Medicare Other

## 2013-09-16 ENCOUNTER — Telehealth: Payer: Self-pay | Admitting: Oncology

## 2013-09-16 VITALS — BP 105/61 | HR 71 | Temp 98.7°F | Resp 18 | Ht 77.0 in | Wt 173.8 lb

## 2013-09-16 DIAGNOSIS — C61 Malignant neoplasm of prostate: Secondary | ICD-10-CM

## 2013-09-16 DIAGNOSIS — E291 Testicular hypofunction: Secondary | ICD-10-CM

## 2013-09-16 DIAGNOSIS — R110 Nausea: Secondary | ICD-10-CM

## 2013-09-16 DIAGNOSIS — N189 Chronic kidney disease, unspecified: Secondary | ICD-10-CM

## 2013-09-16 DIAGNOSIS — C7952 Secondary malignant neoplasm of bone marrow: Secondary | ICD-10-CM

## 2013-09-16 DIAGNOSIS — C7951 Secondary malignant neoplasm of bone: Secondary | ICD-10-CM

## 2013-09-16 DIAGNOSIS — G609 Hereditary and idiopathic neuropathy, unspecified: Secondary | ICD-10-CM

## 2013-09-16 LAB — CBC WITH DIFFERENTIAL/PLATELET
BASO%: 0.7 % (ref 0.0–2.0)
Basophils Absolute: 0 10*3/uL (ref 0.0–0.1)
EOS ABS: 0 10*3/uL (ref 0.0–0.5)
EOS%: 0.9 % (ref 0.0–7.0)
HCT: 36.5 % — ABNORMAL LOW (ref 38.4–49.9)
HEMOGLOBIN: 12.1 g/dL — AB (ref 13.0–17.1)
LYMPH%: 23.7 % (ref 14.0–49.0)
MCH: 28 pg (ref 27.2–33.4)
MCHC: 33.1 g/dL (ref 32.0–36.0)
MCV: 84.7 fL (ref 79.3–98.0)
MONO#: 1 10*3/uL — AB (ref 0.1–0.9)
MONO%: 18.7 % — ABNORMAL HIGH (ref 0.0–14.0)
NEUT%: 56 % (ref 39.0–75.0)
NEUTROS ABS: 2.9 10*3/uL (ref 1.5–6.5)
Platelets: 207 10*3/uL (ref 140–400)
RBC: 4.31 10*6/uL (ref 4.20–5.82)
RDW: 14.2 % (ref 11.0–14.6)
WBC: 5.2 10*3/uL (ref 4.0–10.3)
lymph#: 1.2 10*3/uL (ref 0.9–3.3)

## 2013-09-16 MED ORDER — ONDANSETRON 8 MG PO TBDP: 8.0000 mg | ORAL_TABLET | Freq: Three times a day (TID) | ORAL | Status: DC | PRN

## 2013-09-16 MED ORDER — GABAPENTIN 300 MG PO CAPS: 300.0000 mg | ORAL_CAPSULE | Freq: Three times a day (TID) | ORAL | Status: AC

## 2013-09-16 NOTE — Telephone Encounter (Signed)
Per staff phone call and POF I have schedueld appts.  JMW  

## 2013-09-16 NOTE — Telephone Encounter (Signed)
Pt was here but did not waiti for appt calendar, mailed appt to his house for APril 14th and beyond

## 2013-09-16 NOTE — Progress Notes (Signed)
Hematology and Oncology Follow Up Visit  Jack Barrett [id number] 1945/09/26 68 y.o. 09/16/2013 10:41 AM No PCP Per PatientNo ref. provider found   Principle Diagnosis: 68 year old gentleman with castration resistant prostate cancer with metastatic disease to the bone. He was initially diagnosed in 2007 with a Gleason score 4+5 equals 9.   Prior Therapy: He is status post radical prostatectomy with lymph node dissection 09/04/2006. Postop radiation required for local invasion. Persistent elevation of the PSA following surgery and treated with Lupron. PSA continued to rise.  He initially received Casodex and then enzalutamide. Enzalutamide was stopped for GI intolerance.  Progression in bone in May 2013. He was started on a trial of Taxotere 09/30/2012 with poor tolerance.  He subsequently relocated to [place] with family. He was started on a trial of abiraterone with low-dose prednisone August 2014. He was hospitalized September 2014 with a Port-A-Cath infection.   He did not respond to the abiraterone/prednisone with a continued rise in the PSA. Bone scan 06/23/2013 showed focal area of increased activity in the right anterior lateral fourth rib, left posterior eighth rib, sternum, right L1 vertebral body, possibly lower cervical spine.  Current therapy: Taxotere chemotherapy and 50 mg per meter square every 3 weeks starting on 08/11/2013. Last treatment given on 09/01/2013.  Interim History:  Mr. Hoeffner presents today for a followup visit. He is a gentleman with the above history has been under the care of Dr. Beryle Beams for the management of advanced prostate cancer. He have tolerated Taxotere without any major complications but with you issues at this time. He is reporting grade 1-2 nausea but no vomiting. He have also reported grade 1-2 painful neuropathy. His by mouth intake have been reasonable but can be improved. His performance status is limited in his mobility is dependent on using  a walker. He has not had any falls or any hospitalizations recently. His pain level under reasonable control for the time being with the help of long-acting narcotics and Dilaudid for breakthrough. He has not reported any fevers or chills or sweats. He does report occasional episodes of unsteadiness and possible fevers although none documented. He does not report any chest pain or difficulty breathing. Is not reporting any hematochezia or melena. Does not report any musculoskeletal complaints of arthralgias or myalgias.  Medications: I have reviewed the patient's current medications.  Current Outpatient Prescriptions  Medication Sig Dispense Refill  . gabapentin (NEURONTIN) 400 MG capsule Take 400 mg by mouth 2 (two) times daily.      Marland Kitchen ALPRAZolam (XANAX) 0.5 MG tablet Take 0.5 mg by mouth 2 (two) times daily as needed for sleep or anxiety.      Marland Kitchen amLODipine (NORVASC) 10 MG tablet Take 10 mg by mouth daily with breakfast.      . ammonium lactate (AMLACTIN) 12 % cream Apply 1 g topically 2 (two) times daily.      . calcium carbonate (OS-CAL) 600 MG TABS tablet Take 600 mg by mouth daily with breakfast.      . Carbomer Gel Base (HYDROGEL) GEL 1 application by Does not apply route daily. Apply to right chest      . cholecalciferol (VITAMIN D) 1000 UNITS tablet Take 1,000 Units by mouth daily.      . collagenase (SANTYL) ointment Apply 1 application topically 2 (two) times daily. To coccyx and cover with dry dressing      . gabapentin (NEURONTIN) 300 MG capsule Take 1 capsule (300 mg total) by mouth 3 (three) times  daily.  90 capsule  1  . guaiFENesin (ROBITUSSIN) 100 MG/5ML SOLN Take 5 mLs (100 mg total) by mouth every 4 (four) hours as needed for cough or to loosen phlegm.  1200 mL  1  . hydrALAZINE (APRESOLINE) 25 MG tablet Take 25 mg by mouth 3 (three) times daily.      Marland Kitchen HYDROmorphone (DILAUDID) 8 MG tablet Take 1 tablet (8 mg total) by mouth every 4 (four) hours as needed for severe pain.  120  tablet  0  . insulin aspart (NOVOLOG) 100 UNIT/ML injection Inject 5 Units into the skin 3 (three) times daily with meals.      . lidocaine-prilocaine (EMLA) cream Apply small amount to port-a-cath covering with plastic 1.5 hours before use  30 g  0  . morphine (MS CONTIN) 60 MG 12 hr tablet Take 1 tablet (60 mg total) by mouth 3 (three) times daily after meals.  90 tablet  0  . Multiple Vitamins-Minerals (DECUBI-VITE PO) Take 2 tablets by mouth daily.      . nicotine (NICODERM CQ - DOSED IN MG/24 HOURS) 14 mg/24hr patch Place 1 patch onto the skin daily.      . ondansetron (ZOFRAN) 8 MG tablet Take 1 tablet (8 mg total) by mouth 2 (two) times daily. Start the day after chemo, then as needed for nausea or vomiting.  30 tablet  1  . ondansetron (ZOFRAN-ODT) 8 MG disintegrating tablet Take 1 tablet (8 mg total) by mouth every 8 (eight) hours as needed for nausea.  60 tablet  1  . oxyCODONE-acetaminophen (PERCOCET/ROXICET) 5-325 MG per tablet Take 1-2 tablets by mouth every 6 (six) hours as needed for severe pain.  60 tablet  0  . pantoprazole (PROTONIX) 40 MG tablet Take 1 tablet (40 mg total) by mouth daily.  30 tablet  6  . polyethylene glycol (MIRALAX / GLYCOLAX) packet Take 17 g by mouth daily as needed for mild constipation.       No current facility-administered medications for this visit.     Allergies:  Allergies  Allergen Reactions  . Aspirin Other (See Comments)    hemmoriage  . Codeine Other (See Comments)    Hallucinations; takes hydromorphone and morphine at home without issue  . Lactose Intolerance (Gi) Diarrhea    Past Medical History, Surgical history, Social history, and Family History were reviewed and updated.  Review of Systems: Constitutional:  Negative for fever, chills, night sweats, anorexia, weight loss, pain. Cardiovascular: no chest pain or dyspnea on exertion Respiratory: no cough, shortness of breath, or wheezing Neurological: no TIA or stroke  symptoms Dermatological: negative for dry skin, pruritus and rash ENT: negative for - epistaxis or sinus pain Skin: Negative. Gastrointestinal: no abdominal pain, change in bowel habits, or black or bloody stools Genito-Urinary: negative Hematological and Lymphatic: negative for - bleeding problems, jaundice or pallor Breast: negative Musculoskeletal: negative for - gait disturbance or muscle pain Remaining ROS negative. Physical Exam: Blood pressure 105/61, pulse 71, temperature 98.7 F (37.1 C), temperature source Oral, resp. rate 18, height 6\' 5"  (1.956 m), weight 173 lb 12.8 oz (78.835 kg), SpO2 100.00%. ECOG: 2 General appearance: alert and appears older than stated age Head: Normocephalic, without obvious abnormality, atraumatic Neck: no adenopathy, no carotid bruit, no JVD, supple, symmetrical, trachea midline and thyroid not enlarged, symmetric, no tenderness/mass/nodules Lymph nodes: Cervical, supraclavicular, and axillary nodes normal. Heart:regular rate and rhythm, S1, S2 normal, no murmur, click, rub or gallop Lung:chest clear, no wheezing, rales,  normal symmetric air entry Abdomin: soft, non-tender, without masses or organomegaly EXT:no erythema, induration, or nodules   Lab Results: Lab Results  Component Value Date   WBC 5.2 09/16/2013   HGB 12.1* 09/16/2013   HCT 36.5* 09/16/2013   MCV 84.7 09/16/2013   PLT 207 09/16/2013     Chemistry      Component Value Date/Time   NA 135* 09/01/2013 1308   NA 139 06/26/2013 1313   K 4.0 09/01/2013 1308   K 3.9 06/26/2013 1313   CL 99 06/26/2013 1313   CO2 25 09/01/2013 1308   CO2 26 06/26/2013 1313   BUN 14.7 09/01/2013 1308   BUN 20 06/26/2013 1313   CREATININE 1.6* 09/01/2013 1308   CREATININE 1.09 06/26/2013 1313      Component Value Date/Time   CALCIUM 9.0 09/01/2013 1308   CALCIUM 9.2 06/26/2013 1313   ALKPHOS 82 09/01/2013 1308   ALKPHOS 120* 03/17/2013 1900   AST 11 09/01/2013 1308   AST 12 03/17/2013 1900   ALT <6 09/01/2013  1308   ALT 7 03/17/2013 1900   BILITOT 0.36 09/01/2013 1308   BILITOT 0.3 03/17/2013 1900       Impression and Plan:  68 year old gentleman with the following issues:  1. Castration resistant metastatic prostate cancer with disease to the bone. The plan is to continue with low dose Taxotere at 50 mg per meter square every 3 weeks and we will check his PSA today. Response, we will continue on this current regimen. If he develops progression of disease we can consider Jevtana as a alternative. We can also use other agents have been used in the past such as [place] or [place].  2. Androgen depravation: He will continue Lupron every 3 months he will be due with the next treatment on 09/22/2013.  3. Bone health: He will continue Zometa every 6 weeks which will be due on 09/22/2013. We will continue to educate him about complications associated with that clinic osteonecrosis of the jaw as well as renal insufficiency.  4. Chronic renal insufficiency: His last creatinine is 1.6 which will be repeated today.  5. Bone pain: He is currently on long-acting narcotic in the form of MS Contin at 60 mg every 12 hours with breakthrough Dilaudid and seems to be under reasonable control.  6. Peripheral neuropathy: Have given a prescription for gabapentin 300 mg to use 3 times a day as needed.   7. Followup: Will be on 10/13/2013 for evaluation for his next chemotherapy. His next chemotherapy will be 09/22/2013.   Wyatt Portela, MD 4/8/201510:41 AM

## 2013-09-17 LAB — PSA: PSA: 93.2 ng/mL — ABNORMAL HIGH (ref <=4.00)

## 2013-09-21 ENCOUNTER — Encounter: Payer: Self-pay | Admitting: Oncology

## 2013-09-21 NOTE — Progress Notes (Signed)
Will advise PAN no Zytiga.

## 2013-09-22 ENCOUNTER — Ambulatory Visit (HOSPITAL_BASED_OUTPATIENT_CLINIC_OR_DEPARTMENT_OTHER): Payer: Medicare Other

## 2013-09-22 ENCOUNTER — Other Ambulatory Visit (HOSPITAL_BASED_OUTPATIENT_CLINIC_OR_DEPARTMENT_OTHER): Payer: Medicare Other

## 2013-09-22 ENCOUNTER — Other Ambulatory Visit: Payer: Self-pay | Admitting: Internal Medicine

## 2013-09-22 VITALS — BP 140/75 | HR 69 | Temp 97.2°F

## 2013-09-22 DIAGNOSIS — C7951 Secondary malignant neoplasm of bone: Secondary | ICD-10-CM

## 2013-09-22 DIAGNOSIS — C7952 Secondary malignant neoplasm of bone marrow: Secondary | ICD-10-CM

## 2013-09-22 DIAGNOSIS — C61 Malignant neoplasm of prostate: Secondary | ICD-10-CM

## 2013-09-22 DIAGNOSIS — G893 Neoplasm related pain (acute) (chronic): Secondary | ICD-10-CM

## 2013-09-22 DIAGNOSIS — Z5111 Encounter for antineoplastic chemotherapy: Secondary | ICD-10-CM

## 2013-09-22 LAB — COMPREHENSIVE METABOLIC PANEL (CC13)
ALT: <6 U/L (ref 0–55)
AST: 9 U/L (ref 5–34)
Albumin: 3.1 g/dL — ABNORMAL LOW (ref 3.5–5.0)
Alkaline Phosphatase: 89 U/L (ref 40–150)
Anion Gap: 12 mEq/L — ABNORMAL HIGH (ref 3–11)
BUN: 17.6 mg/dL (ref 7.0–26.0)
CALCIUM: 9.4 mg/dL (ref 8.4–10.4)
CHLORIDE: 104 meq/L (ref 98–109)
CO2: 24 mEq/L (ref 22–29)
CREATININE: 1.6 mg/dL — AB (ref 0.7–1.3)
Glucose: 105 mg/dl (ref 70–140)
Potassium: 4.2 mEq/L (ref 3.5–5.1)
Sodium: 140 mEq/L (ref 136–145)
Total Bilirubin: 0.35 mg/dL (ref 0.20–1.20)
Total Protein: 7 g/dL (ref 6.4–8.3)

## 2013-09-22 LAB — CBC WITH DIFFERENTIAL/PLATELET
BASO%: 0.7 % (ref 0.0–2.0)
BASOS ABS: 0.1 10*3/uL (ref 0.0–0.1)
EOS%: 2.4 % (ref 0.0–7.0)
Eosinophils Absolute: 0.2 10*3/uL (ref 0.0–0.5)
HEMATOCRIT: 39.5 % (ref 38.4–49.9)
HEMOGLOBIN: 12.8 g/dL — AB (ref 13.0–17.1)
LYMPH#: 1.6 10*3/uL (ref 0.9–3.3)
LYMPH%: 17.4 % (ref 14.0–49.0)
MCH: 27.4 pg (ref 27.2–33.4)
MCHC: 32.3 g/dL (ref 32.0–36.0)
MCV: 84.9 fL (ref 79.3–98.0)
MONO#: 0.7 10*3/uL (ref 0.1–0.9)
MONO%: 8.1 % (ref 0.0–14.0)
NEUT#: 6.5 10*3/uL (ref 1.5–6.5)
NEUT%: 71.4 % (ref 39.0–75.0)
Platelets: 194 10*3/uL (ref 140–400)
RBC: 4.65 10*6/uL (ref 4.20–5.82)
RDW: 14.3 % (ref 11.0–14.6)
WBC: 9.1 10*3/uL (ref 4.0–10.3)

## 2013-09-22 MED ORDER — SODIUM CHLORIDE 0.9 % IJ SOLN
10.0000 mL | INTRAMUSCULAR | Status: DC | PRN
  Administered 2013-09-22: 10 mL
  Filled 2013-09-22: qty 10

## 2013-09-22 MED ORDER — DEXAMETHASONE SODIUM PHOSPHATE 10 MG/ML IJ SOLN
INTRAMUSCULAR | Status: AC
  Filled 2013-09-22: qty 1

## 2013-09-22 MED ORDER — ONDANSETRON 8 MG/50ML IVPB (CHCC)
8.0000 mg | Freq: Once | INTRAVENOUS | Status: AC
  Administered 2013-09-22: 8 mg via INTRAVENOUS

## 2013-09-22 MED ORDER — HEPARIN SOD (PORK) LOCK FLUSH 100 UNIT/ML IV SOLN
500.0000 [IU] | Freq: Once | INTRAVENOUS | Status: AC | PRN
  Administered 2013-09-22: 500 [IU]
  Filled 2013-09-22: qty 5

## 2013-09-22 MED ORDER — ZOLEDRONIC ACID 4 MG/100ML IV SOLN
4.0000 mg | Freq: Once | INTRAVENOUS | Status: AC
  Administered 2013-09-22: 4 mg via INTRAVENOUS
  Filled 2013-09-22: qty 100

## 2013-09-22 MED ORDER — LEUPROLIDE ACETATE (3 MONTH) 22.5 MG IM KIT
22.5000 mg | PACK | Freq: Once | INTRAMUSCULAR | Status: AC
  Administered 2013-09-22: 22.5 mg via INTRAMUSCULAR
  Filled 2013-09-22: qty 22.5

## 2013-09-22 MED ORDER — HYDROMORPHONE HCL 4 MG PO TABS: 8.0000 mg | ORAL_TABLET | ORAL | Status: DC | PRN

## 2013-09-22 MED ORDER — DEXAMETHASONE SODIUM PHOSPHATE 10 MG/ML IJ SOLN
10.0000 mg | Freq: Once | INTRAMUSCULAR | Status: AC
  Administered 2013-09-22: 10 mg via INTRAVENOUS

## 2013-09-22 MED ORDER — SODIUM CHLORIDE 0.9 % IV SOLN
Freq: Once | INTRAVENOUS | Status: AC
  Administered 2013-09-22: 13:00:00 via INTRAVENOUS

## 2013-09-22 MED ORDER — DOCETAXEL CHEMO INJECTION 160 MG/16ML
50.0000 mg/m2 | Freq: Once | INTRAVENOUS | Status: AC
  Administered 2013-09-22: 100 mg via INTRAVENOUS
  Filled 2013-09-22: qty 10

## 2013-09-22 MED ORDER — ONDANSETRON 8 MG/NS 50 ML IVPB
INTRAVENOUS | Status: AC
  Filled 2013-09-22: qty 8

## 2013-09-22 NOTE — Progress Notes (Signed)
Dr. Alen Blew out this week.  I wrote for dilaudid 8 mg (two 4 mg tabs) instead of the 8 mg tab because its cheaper per pharmacy.  I wrote for a total of 240 based on his prior 120 used in the past.

## 2013-09-22 NOTE — Progress Notes (Signed)
Dr. Juliann Mule reviewed lab results today.  Proceed with chemo as ordered.

## 2013-09-22 NOTE — Progress Notes (Signed)
1545-Pt states that there have been issues with his insurance and that they will not cover his prescription costs until 10/09/13.  Aaron Edelman at [place] called to check on prescriptions and coverage.  Information confirmed that scripts will not be covered until 10/09/13.  Pt.'s daughter called and will bring in pt.'s bank statement when she picks up patient to take to financial advocates to see if [facility] can assist with prescriptions.   When daughter arrived, she did not have her father's bank statement with her.  She states that she will bring it back possibly tonight, if not tomorrow.

## 2013-09-22 NOTE — Patient Instructions (Signed)
[facility] Discharge Instructions for Patients Receiving Chemotherapy  Today you received the following chemotherapy agents :  Taxotere,  Zometa,  Lupron.  To help prevent nausea and vomiting after your treatment, we encourage you to take your nausea medication as instructed by your physician.   If you develop nausea and vomiting that is not controlled by your nausea medication, call the clinic.   BELOW ARE SYMPTOMS THAT SHOULD BE REPORTED IMMEDIATELY:  *FEVER GREATER THAN 100.5 F  *CHILLS WITH OR WITHOUT FEVER  NAUSEA AND VOMITING THAT IS NOT CONTROLLED WITH YOUR NAUSEA MEDICATION  *UNUSUAL SHORTNESS OF BREATH  *UNUSUAL BRUISING OR BLEEDING  TENDERNESS IN MOUTH AND THROAT WITH OR WITHOUT PRESENCE OF ULCERS  *URINARY PROBLEMS  *BOWEL PROBLEMS  UNUSUAL RASH Items with * indicate a potential emergency and should be followed up as soon as possible.  Feel free to call the clinic you have any questions or concerns. The clinic phone number is [phone].

## 2013-09-23 ENCOUNTER — Telehealth: Payer: Self-pay | Admitting: Dietician

## 2013-09-23 NOTE — Telephone Encounter (Signed)
Brief Outpatient Oncology Nutrition Note  Patient has been identified to be at risk on malnutrition screen.  Wt Readings from Last 10 Encounters:  09/16/13 173 lb 12.8 oz (78.835 kg)  08/25/13 175 lb (79.379 kg)  08/04/13 174 lb 12.8 oz (79.289 kg)  06/26/13 180 lb (81.647 kg)  06/16/13 180 lb 6.4 oz (81.829 kg)  03/25/13 175 lb 8 oz (79.606 kg)  03/17/13 176 lb (79.833 kg)  03/08/13 207 lb 8 oz (94.121 kg)  03/08/13 207 lb 8 oz (94.121 kg)  03/06/13 184 lb (83.462 kg)    Dx:  Prostate cancer.  Patient of Dr. Alen Blew.  Called patient due to weight loss.  Patient reports decreased intake secondary to nausea and vomiting after treatment.  Patient has Ensure Powder but is unsure how to mix this.  Mixing Directions given for the Ensure Powder, tips for maintaining hydration and intake with nausea.  Will mail coupons for Ensure along with recipes, tips to avoid dehydration, tips for nausea and vomiting and tips to increase calories and protein.  [facility] contact information also provided.  Antonieta Iba, RD, LDN

## 2013-09-24 ENCOUNTER — Encounter: Payer: Self-pay | Admitting: Oncology

## 2013-09-24 NOTE — Progress Notes (Signed)
Daughter bought in Engineering geologist for 400.00 grant. She is picking up his meds.

## 2013-10-09 ENCOUNTER — Telehealth: Payer: Self-pay | Admitting: *Deleted

## 2013-10-09 ENCOUNTER — Other Ambulatory Visit: Payer: Self-pay | Admitting: *Deleted

## 2013-10-09 ENCOUNTER — Other Ambulatory Visit: Payer: Self-pay | Admitting: Medical Oncology

## 2013-10-09 ENCOUNTER — Telehealth: Payer: Self-pay | Admitting: Medical Oncology

## 2013-10-09 DIAGNOSIS — C61 Malignant neoplasm of prostate: Secondary | ICD-10-CM

## 2013-10-09 MED ORDER — MORPHINE SULFATE ER 60 MG PO TBCR: 60.0000 mg | EXTENDED_RELEASE_TABLET | Freq: Three times a day (TID) | ORAL | Status: DC

## 2013-10-09 MED ORDER — OXYCODONE-ACETAMINOPHEN 5-325 MG PO TABS: 1.0000 | ORAL_TABLET | Freq: Four times a day (QID) | ORAL | Status: DC | PRN

## 2013-10-09 MED ORDER — ONDANSETRON 8 MG PO TBDP: 8.0000 mg | ORAL_TABLET | Freq: Three times a day (TID) | ORAL | Status: DC | PRN

## 2013-10-09 NOTE — Telephone Encounter (Signed)
Daughter called stating wrong prescription ordered for patient at pharmacy and patient needed xanax not zofran. States pt "gets verbally abusive with her and she can't handle him like that."   Informed daughter per notes and conversation with triage nurse that patient had, patient requested something for nausea and did not ask for xanax. Informed daughter MD will review need for xanax with patient on May 5th appt. Daughter stating she doesn't understand why she needs to wait for him to have this prescription, that she is his power of attorney and should be able to get this prescription for him. Informed daughter that patient do not request this prescription, per triage nurse, and even though she is his POA he still has a right to decided which medications he chooses to have nor not. Daughter at this point places patient on the phone to tell me that we ordered wrong medication. Informed patient that MD will discuss need for xanax at his upcoming appt. Patient expressed understanding. Denies questions at this time.   Patient has not had xanax prescription since 04/2013  MD informed of conversation. Triage referred pt situation to social work staff.

## 2013-10-09 NOTE — Telephone Encounter (Signed)
Patient's daughter calling for refill of patient's pain medication, ms contin and percocet. Per MD, ok to refill. Call to daughter and patient, prescription ready for p.u. Patient expressed thanks. Confirmed next weeks appt with him.
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
## 2013-10-09 NOTE — Telephone Encounter (Signed)
Zofran order sent as discussed in today's phone note.

## 2013-10-09 NOTE — Telephone Encounter (Signed)
Daughter called collaborative from pharmacy reporting refill should be for "xanax not zofran".  This nurse notified of daughter's call.  Notified Dr. Alen Blew of daughter's request for alprazolam today.  Alprazolam last filled in November 2014 and calls.  Verbal order received and read back from Dr. Alen Blew not to refill alprazolam at this time.  Called daughter Mechel Schutter to notify her.  "My dad is miserable, he's nauseated and this helps.  I just give it to him.  He doesn't know what it is if you told him alprazolam, he's on thirty different medicines and I control the medicines because he gets confused."  Again stated last order date for Nicolette and Dad was asked about sleep and anxiety when I spoke with him.  Nicolette expressed "I don't understand why your office won't refill a medicine that was filled regularly for him."  Call ended.

## 2013-10-13 ENCOUNTER — Ambulatory Visit (HOSPITAL_BASED_OUTPATIENT_CLINIC_OR_DEPARTMENT_OTHER): Payer: Medicare Other | Admitting: Oncology

## 2013-10-13 ENCOUNTER — Ambulatory Visit (HOSPITAL_BASED_OUTPATIENT_CLINIC_OR_DEPARTMENT_OTHER): Payer: Medicare Other

## 2013-10-13 ENCOUNTER — Encounter: Payer: Self-pay | Admitting: Oncology

## 2013-10-13 ENCOUNTER — Other Ambulatory Visit: Payer: Self-pay | Admitting: Medical Oncology

## 2013-10-13 VITALS — BP 116/69 | HR 76 | Temp 99.6°F | Resp 18 | Ht 77.0 in | Wt 177.9 lb

## 2013-10-13 DIAGNOSIS — Z95828 Presence of other vascular implants and grafts: Secondary | ICD-10-CM

## 2013-10-13 DIAGNOSIS — E291 Testicular hypofunction: Secondary | ICD-10-CM

## 2013-10-13 DIAGNOSIS — G893 Neoplasm related pain (acute) (chronic): Secondary | ICD-10-CM

## 2013-10-13 DIAGNOSIS — C7952 Secondary malignant neoplasm of bone marrow: Secondary | ICD-10-CM

## 2013-10-13 DIAGNOSIS — C61 Malignant neoplasm of prostate: Secondary | ICD-10-CM

## 2013-10-13 DIAGNOSIS — G609 Hereditary and idiopathic neuropathy, unspecified: Secondary | ICD-10-CM

## 2013-10-13 DIAGNOSIS — C7951 Secondary malignant neoplasm of bone: Secondary | ICD-10-CM

## 2013-10-13 DIAGNOSIS — Z5111 Encounter for antineoplastic chemotherapy: Secondary | ICD-10-CM

## 2013-10-13 DIAGNOSIS — N189 Chronic kidney disease, unspecified: Secondary | ICD-10-CM

## 2013-10-13 LAB — COMPREHENSIVE METABOLIC PANEL (CC13)
ALT: <6 U/L (ref 0–55)
ANION GAP: 9 meq/L (ref 3–11)
AST: 8 U/L (ref 5–34)
Albumin: 2.4 g/dL — ABNORMAL LOW (ref 3.5–5.0)
Alkaline Phosphatase: 73 U/L (ref 40–150)
BUN: 17.8 mg/dL (ref 7.0–26.0)
CO2: 22 meq/L (ref 22–29)
Calcium: 8.6 mg/dL (ref 8.4–10.4)
Chloride: 112 mEq/L — ABNORMAL HIGH (ref 98–109)
Creatinine: 1.9 mg/dL — ABNORMAL HIGH (ref 0.7–1.3)
Glucose: 114 mg/dl (ref 70–140)
Potassium: 4 mEq/L (ref 3.5–5.1)
SODIUM: 143 meq/L (ref 136–145)
TOTAL PROTEIN: 5.9 g/dL — AB (ref 6.4–8.3)

## 2013-10-13 LAB — CBC WITH DIFFERENTIAL/PLATELET
BASO%: 0.5 % (ref 0.0–2.0)
Basophils Absolute: 0 10*3/uL (ref 0.0–0.1)
EOS%: 1.6 % (ref 0.0–7.0)
Eosinophils Absolute: 0.2 10*3/uL (ref 0.0–0.5)
HCT: 30 % — ABNORMAL LOW (ref 38.4–49.9)
HGB: 9.9 g/dL — ABNORMAL LOW (ref 13.0–17.1)
LYMPH#: 1.2 10*3/uL (ref 0.9–3.3)
LYMPH%: 11.8 % — AB (ref 14.0–49.0)
MCH: 28 pg (ref 27.2–33.4)
MCHC: 33.1 g/dL (ref 32.0–36.0)
MCV: 84.8 fL (ref 79.3–98.0)
MONO#: 1 10*3/uL — AB (ref 0.1–0.9)
MONO%: 9.6 % (ref 0.0–14.0)
NEUT#: 7.7 10*3/uL — ABNORMAL HIGH (ref 1.5–6.5)
NEUT%: 76.5 % — ABNORMAL HIGH (ref 39.0–75.0)
PLATELETS: 166 10*3/uL (ref 140–400)
RBC: 3.54 10*6/uL — AB (ref 4.20–5.82)
RDW: 14.6 % (ref 11.0–14.6)
WBC: 10.1 10*3/uL (ref 4.0–10.3)

## 2013-10-13 MED ORDER — LIDOCAINE-PRILOCAINE 2.5-2.5 % EX CREA: TOPICAL_CREAM | CUTANEOUS | Status: DC

## 2013-10-13 MED ORDER — DOCETAXEL CHEMO INJECTION 160 MG/16ML
50.0000 mg/m2 | Freq: Once | INTRAVENOUS | Status: AC
  Administered 2013-10-13: 100 mg via INTRAVENOUS
  Filled 2013-10-13: qty 10

## 2013-10-13 MED ORDER — SODIUM CHLORIDE 0.9 % IJ SOLN
10.0000 mL | INTRAMUSCULAR | Status: DC | PRN
  Administered 2013-10-13: 10 mL via INTRAVENOUS
  Filled 2013-10-13: qty 10

## 2013-10-13 MED ORDER — HEPARIN SOD (PORK) LOCK FLUSH 100 UNIT/ML IV SOLN
500.0000 [IU] | Freq: Once | INTRAVENOUS | Status: AC | PRN
  Administered 2013-10-13: 500 [IU]
  Filled 2013-10-13: qty 5

## 2013-10-13 MED ORDER — ONDANSETRON 8 MG/50ML IVPB (CHCC)
8.0000 mg | Freq: Once | INTRAVENOUS | Status: AC
  Administered 2013-10-13: 8 mg via INTRAVENOUS

## 2013-10-13 MED ORDER — DEXAMETHASONE SODIUM PHOSPHATE 10 MG/ML IJ SOLN
10.0000 mg | Freq: Once | INTRAMUSCULAR | Status: AC
  Administered 2013-10-13: 10 mg via INTRAVENOUS

## 2013-10-13 MED ORDER — DEXAMETHASONE SODIUM PHOSPHATE 10 MG/ML IJ SOLN
INTRAMUSCULAR | Status: AC
  Filled 2013-10-13: qty 1

## 2013-10-13 MED ORDER — ALPRAZOLAM 1 MG PO TABS: 0.5000 mg | ORAL_TABLET | Freq: Three times a day (TID) | ORAL | Status: DC | PRN

## 2013-10-13 MED ORDER — ONDANSETRON 8 MG/NS 50 ML IVPB
INTRAVENOUS | Status: AC
  Filled 2013-10-13: qty 8

## 2013-10-13 MED ORDER — SODIUM CHLORIDE 0.9 % IJ SOLN
10.0000 mL | INTRAMUSCULAR | Status: DC | PRN
  Administered 2013-10-13: 10 mL
  Filled 2013-10-13: qty 10

## 2013-10-13 MED ORDER — SODIUM CHLORIDE 0.9 % IV SOLN
Freq: Once | INTRAVENOUS | Status: AC
  Administered 2013-10-13: 10:00:00 via INTRAVENOUS

## 2013-10-13 MED ORDER — HEPARIN SOD (PORK) LOCK FLUSH 100 UNIT/ML IV SOLN
500.0000 [IU] | Freq: Once | INTRAVENOUS | Status: DC
  Filled 2013-10-13: qty 5

## 2013-10-13 NOTE — Progress Notes (Signed)
Hematology and Oncology Follow Up Visit  Jack Barrett [id number] 06-28-1945 68 y.o. 10/13/2013 10:13 AM No PCP Per PatientNo ref. provider found   Principle Diagnosis: 68 year old gentleman with castration resistant prostate cancer with metastatic disease to the bone. He was initially diagnosed in 2007 with a Gleason score 4+5 equals 9.   Prior Therapy: He is status post radical prostatectomy with lymph node dissection 09/04/2006. Postop radiation required for local invasion. Persistent elevation of the PSA following surgery and treated with Lupron. PSA continued to rise.  He initially received Casodex and then enzalutamide. Enzalutamide was stopped for GI intolerance.  Progression in bone in May 2013. He was started on a trial of Taxotere 09/30/2012 with poor tolerance.  He subsequently relocated to [place] with family. He was started on a trial of abiraterone with low-dose prednisone August 2014. He was hospitalized September 2014 with a Port-A-Cath infection.   He did not respond to the abiraterone/prednisone with a continued rise in the PSA. Bone scan 06/23/2013 showed focal area of increased activity in the right anterior lateral fourth rib, left posterior eighth rib, sternum, right L1 vertebral body, possibly lower cervical spine.  Current therapy: Taxotere chemotherapy and 50 mg per meter square every 3 weeks starting on 08/11/2013. Last treatment given on 09/22/2013. He is here for cycle 4.   Interim History:  Jack Barrett presents today for a followup visit. He is a gentleman with the above history has been under the care of Dr. Beryle Beams for the management of advanced prostate cancer. He have tolerated Taxotere but continues reporting grade 1-2 nausea but no vomiting. He reports Zofran helps with these symptoms. He have also reported grade 1-2 painful neuropathy. His po intake have been reasonable. His performance status is limited in his mobility is dependent on using a walker. He  has not had any falls or any hospitalizations recently. His pain level under reasonable control for the time being with the help of long-acting narcotics and Dilaudid for breakthrough. He has not reported any fevers or chills or sweats. He does report occasional episodes of unsteadiness and possible fevers although none documented. He does not report any chest pain or difficulty breathing. Is not reporting any hematochezia or melena. Does not report any musculoskeletal complaints of arthralgias or myalgias. He does report symptoms of anxiety which he uses Xanax as needed. He also have noted pain in the buttocks area and have been diagnosed with pilonidal cysts. He has not reported any fevers but does report occasional drainage from that area. He is having more difficulty sitting for an extended period of time.  Medications: I have reviewed the patient's current medications.  Current Outpatient Prescriptions  Medication Sig Dispense Refill  . ALPRAZolam (XANAX) 1 MG tablet Take 0.5 tablets (0.5 mg total) by mouth 3 (three) times daily as needed for anxiety or sleep.  90 tablet  0  . amLODipine (NORVASC) 10 MG tablet Take 10 mg by mouth daily with breakfast.      . ammonium lactate (AMLACTIN) 12 % cream Apply 1 g topically 2 (two) times daily.      . calcium carbonate (OS-CAL) 600 MG TABS tablet Take 600 mg by mouth daily with breakfast.      . Carbomer Gel Base (HYDROGEL) GEL 1 application by Does not apply route daily. Apply to right chest      . cholecalciferol (VITAMIN D) 1000 UNITS tablet Take 1,000 Units by mouth daily.      . collagenase (SANTYL) ointment Apply  1 application topically 2 (two) times daily. To coccyx and cover with dry dressing      . gabapentin (NEURONTIN) 300 MG capsule Take 1 capsule (300 mg total) by mouth 3 (three) times daily.  90 capsule  1  . gabapentin (NEURONTIN) 400 MG capsule Take 400 mg by mouth 2 (two) times daily.      Marland Kitchen guaiFENesin (ROBITUSSIN) 100 MG/5ML SOLN Take 5  mLs (100 mg total) by mouth every 4 (four) hours as needed for cough or to loosen phlegm.  1200 mL  1  . hydrALAZINE (APRESOLINE) 25 MG tablet Take 25 mg by mouth 3 (three) times daily.      Marland Kitchen HYDROmorphone (DILAUDID) 4 MG tablet Take 2 tablets (8 mg total) by mouth every 4 (four) hours as needed for severe pain.  240 tablet  0  . insulin aspart (NOVOLOG) 100 UNIT/ML injection Inject 5 Units into the skin 3 (three) times daily with meals.      . lidocaine-prilocaine (EMLA) cream Apply small amount to port-a-cath covering with plastic 1.5 hours before use  30 g  0  . morphine (MS CONTIN) 60 MG 12 hr tablet Take 1 tablet (60 mg total) by mouth 3 (three) times daily after meals.  90 tablet  0  . Multiple Vitamins-Minerals (DECUBI-VITE PO) Take 2 tablets by mouth daily.      . nicotine (NICODERM CQ - DOSED IN MG/24 HOURS) 14 mg/24hr patch Place 1 patch onto the skin daily.      . ondansetron (ZOFRAN) 8 MG tablet Take 1 tablet (8 mg total) by mouth 2 (two) times daily. Start the day after chemo, then as needed for nausea or vomiting.  30 tablet  1  . ondansetron (ZOFRAN-ODT) 8 MG disintegrating tablet Take 1 tablet (8 mg total) by mouth every 8 (eight) hours as needed for nausea.  60 tablet  1  . oxyCODONE-acetaminophen (PERCOCET/ROXICET) 5-325 MG per tablet Take 1-2 tablets by mouth every 6 (six) hours as needed for severe pain.  60 tablet  0  . pantoprazole (PROTONIX) 40 MG tablet Take 1 tablet (40 mg total) by mouth daily.  30 tablet  6  . polyethylene glycol (MIRALAX / GLYCOLAX) packet Take 17 g by mouth daily as needed for mild constipation.       No current facility-administered medications for this visit.   Facility-Administered Medications Ordered in Other Visits  Medication Dose Route Frequency Provider Last Rate Last Dose  . 0.9 %  sodium chloride infusion   Intravenous Once Wyatt Portela, MD      . dexamethasone (DECADRON) injection 10 mg  10 mg Intravenous Once Wyatt Portela, MD      .  DOCEtaxel (TAXOTERE) 100 mg in dextrose 5 % 250 mL chemo infusion  50 mg/m2 (Treatment Plan Actual) Intravenous Once Wyatt Portela, MD      . heparin lock flush 100 unit/mL  500 Units Intravenous Once Wyatt Portela, MD      . heparin lock flush 100 unit/mL  500 Units Intracatheter Once PRN Wyatt Portela, MD      . ondansetron (ZOFRAN) IVPB 8 mg  8 mg Intravenous Once Wyatt Portela, MD      . sodium chloride 0.9 % injection 10 mL  10 mL Intravenous PRN Wyatt Portela, MD   10 mL at 10/13/13 0847  . sodium chloride 0.9 % injection 10 mL  10 mL Intracatheter PRN Wyatt Portela, MD  Allergies:  Allergies  Allergen Reactions  . Aspirin Other (See Comments)    hemmoriage  . Codeine Other (See Comments)    Hallucinations; takes hydromorphone and morphine at home without issue  . Lactose Intolerance (Gi) Diarrhea    Past Medical History, Surgical history, Social history, and Family History were reviewed and updated.  Review of Systems: Constitutional:  Negative for fever, chills, night sweats, anorexia, does report weight loss and pain. Cardiovascular: no chest pain or dyspnea on exertion Respiratory: no cough, shortness of breath, or wheezing Neurological: no TIA or stroke symptoms Dermatological: negative for dry skin, pruritus and rash ENT: negative for - epistaxis or sinus pain Skin: Negative. Gastrointestinal: no abdominal pain, change in bowel habits, or black or bloody stools Genito-Urinary: negative Hematological and Lymphatic: negative for - bleeding problems, jaundice or pallor Breast: negative Musculoskeletal: negative for - gait disturbance or muscle pain Remaining ROS negative. Physical Exam: Blood pressure 116/69, pulse 76, temperature 99.6 F (37.6 C), temperature source Oral, resp. rate 18, height 6\' 5"  (1.956 m), weight 177 lb 14.4 oz (80.695 kg), SpO2 98.00%. ECOG: 2 General appearance: alert and appears older than stated age Head: Normocephalic, without  obvious abnormality, atraumatic Neck: no adenopathy, no carotid bruit, no JVD, supple, symmetrical, trachea midline and thyroid not enlarged, symmetric, no tenderness/mass/nodules Lymph nodes: Cervical, supraclavicular, and axillary nodes normal. Heart:regular rate and rhythm, S1, S2 normal, no murmur, click, rub or gallop Lung:chest clear, no wheezing, rales, normal symmetric air entry Abdomin: soft, non-tender, without masses or organomegaly EXT:no erythema, induration, or nodules Buttocks area showed multiple intergluteal cavities with a foul smelling drainage.   Lab Results: Lab Results  Component Value Date   WBC 10.1 10/13/2013   HGB 9.9* 10/13/2013   HCT 30.0* 10/13/2013   MCV 84.8 10/13/2013   PLT 166 10/13/2013     Chemistry      Component Value Date/Time   NA 143 10/13/2013 0844   NA 139 06/26/2013 1313   K 4.0 10/13/2013 0844   K 3.9 06/26/2013 1313   CL 99 06/26/2013 1313   CO2 22 10/13/2013 0844   CO2 26 06/26/2013 1313   BUN 17.8 10/13/2013 0844   BUN 20 06/26/2013 1313   CREATININE 1.9* 10/13/2013 0844   CREATININE 1.09 06/26/2013 1313      Component Value Date/Time   CALCIUM 8.6 10/13/2013 0844   CALCIUM 9.2 06/26/2013 1313   ALKPHOS 73 10/13/2013 0844   ALKPHOS 120* 03/17/2013 1900   AST 8 10/13/2013 0844   AST 12 03/17/2013 1900   ALT <6 10/13/2013 0844   ALT 7 03/17/2013 1900   BILITOT <0.20 10/13/2013 0844   BILITOT 0.3 03/17/2013 1900     Results for Jack Barrett, Jack Barrett (MRN [id number]) as of 10/13/2013 09:53  Ref. Range 06/26/2013 10:26 08/04/2013 15:23 09/16/2013 09:35  PSA Latest Range: <=4.00 ng/mL 38.37 (H) 72.22 (H) 93.20 (H)    Impression and Plan:  68 year old gentleman with the following issues:  1. Castration resistant metastatic prostate cancer with disease to the bone. He is on low dose Taxotere at 50 mg per meter square every 3 weeks and his PSA today is pending from today. His previous PSA had increased slightly from 72-93 despite him been on chemotherapy. We will continue on  this current regimen the time being. If he develops progression of disease with continuous rise of PSA, we can consider Jevtana as a alternative. We can also use other agents have been used in the past such as  Xtandi or [employer].  2. Androgen depravation: He will continue Lupron every 3 months he will be due with the next treatment on 12/2013.   3. Bone health: He will continue Zometa every 6 weeks which was last given on  09/22/2013. We will continue to educate him about complications associated with that clinic osteonecrosis of the jaw as well as renal insufficiency.  4. Chronic renal insufficiency: His last creatinine is 1.9 which close to his baseline.  5. Bone pain: He is currently on long-acting narcotic in the form of MS Contin at 60 mg every 12 hours with breakthrough Dilaudid and seems to be under reasonable control.  6. Peripheral neuropathy: on gabapentin 300 mg to use 3 times a day as needed.   7. anxiety: He does use Xanax 2-3 times a day for both anxiety and sleep. I have refilled this medication for him today with clear instructions and complications associated with this medicine.  8. Pilonidal cyst: He have had this problem in the past. His examination today revealed multiple intergluteal cavities with drainage and pain. I will refer her for general surgery evaluation for possible debridement or any other intervention that is needed.  9. Followup: Will be on 11/03/2013 for evaluation for his next chemotherapy.    Wyatt Portela, MD 5/5/201510:13 AM

## 2013-10-13 NOTE — Patient Instructions (Signed)

## 2013-10-13 NOTE — Patient Instructions (Signed)
[facility] Discharge Instructions for Patients Receiving Chemotherapy  Today you received the following chemotherapy agents: Taxotere.  To help prevent nausea and vomiting after your treatment, we encourage you to take your nausea medication, Ondansetron. Take it twice daily as needed.    If you develop nausea and vomiting that is not controlled by your nausea medication, call the clinic.   BELOW ARE SYMPTOMS THAT SHOULD BE REPORTED IMMEDIATELY:  *FEVER GREATER THAN 100.5 F  *CHILLS WITH OR WITHOUT FEVER  NAUSEA AND VOMITING THAT IS NOT CONTROLLED WITH YOUR NAUSEA MEDICATION  *UNUSUAL SHORTNESS OF BREATH  *UNUSUAL BRUISING OR BLEEDING  TENDERNESS IN MOUTH AND THROAT WITH OR WITHOUT PRESENCE OF ULCERS  *URINARY PROBLEMS  *BOWEL PROBLEMS  UNUSUAL RASH Items with * indicate a potential emergency and should be followed up as soon as possible.  Feel free to call the clinic should you have any questions or concerns. The clinic phone number is [phone].

## 2013-10-13 NOTE — Progress Notes (Signed)
CMET reviewed by Dr. Alen Blew. OK to treat with Creatinine 1.9 today, per MD. 1115: Pt reports muscle spasms "all over but mainly on L side." Pt requesting muscle relaxer. None noted on med list. Per pt- he has one at home that starts with Cy--. Request forwarded to MD.

## 2013-10-14 LAB — PSA: PSA: 105.7 ng/mL — ABNORMAL HIGH (ref <=4.00)

## 2013-10-16 ENCOUNTER — Telehealth: Payer: Self-pay | Admitting: Oncology

## 2013-10-16 ENCOUNTER — Telehealth: Payer: Self-pay | Admitting: *Deleted

## 2013-10-16 NOTE — Telephone Encounter (Signed)
Sent Jack Barrett a staff messagre to add the chemo appt on 11/03/2013.

## 2013-10-16 NOTE — Telephone Encounter (Signed)
Per staff message and POF I have scheduled appts.  No available for treatment on 5/26 moved to 5/28.  Scheduler [email or web address]

## 2013-10-19 ENCOUNTER — Telehealth: Payer: Self-pay | Admitting: Dietician

## 2013-10-19 NOTE — Telephone Encounter (Signed)
Brief Outpatient Oncology Nutrition Note  Patient has been identified to be at risk on malnutrition screen.  Wt Readings from Last 10 Encounters:  10/13/13 177 lb 14.4 oz (80.695 kg)  09/16/13 173 lb 12.8 oz (78.835 kg)  08/25/13 175 lb (79.379 kg)  08/04/13 174 lb 12.8 oz (79.289 kg)  06/26/13 180 lb (81.647 kg)  06/16/13 180 lb 6.4 oz (81.829 kg)  03/25/13 175 lb 8 oz (79.606 kg)  03/17/13 176 lb (79.833 kg)  03/08/13 207 lb 8 oz (94.121 kg)  03/08/13 207 lb 8 oz (94.121 kg)      Dx:  Metastatic Prostate cancer  Called patient due to weight loss.  Weight appears to be trending up again.  Patient reports nausea and vomiting this am.  Working to maintain hydration but would like to come in for IVF.  Drinks Glucerna at times.  Reports that he is not always able to care for himself and would like a nurse to come in.    Tips given to help with nausea and to maintain hydration.  Will discuss patient with social work.  Patient is known to them.  Will mail coupons for Glucerna and tips to help with nausea and vomiting and increase calories and protein.  Contact information for the outpatient cancer center RD will also be sent.    Antonieta Iba, RD, LDN

## 2013-10-20 ENCOUNTER — Other Ambulatory Visit: Payer: Self-pay | Admitting: *Deleted

## 2013-10-20 ENCOUNTER — Telehealth: Payer: Self-pay | Admitting: Oncology

## 2013-10-20 MED ORDER — HYDROMORPHONE HCL 4 MG PO TABS: 8.0000 mg | ORAL_TABLET | ORAL | Status: DC | PRN

## 2013-10-20 NOTE — Telephone Encounter (Signed)
Signed script for dilaudid left at front for patient p/u. Patient notified.

## 2013-10-20 NOTE — Telephone Encounter (Signed)
lmonvm advising the pt of his may appts °

## 2013-10-22 ENCOUNTER — Encounter: Payer: Self-pay | Admitting: *Deleted

## 2013-10-22 NOTE — Progress Notes (Signed)
Faxed notification from [employer] Billing [employer] that his request for reimbursement thorough the [employer] has been denied. The out-of-pocket expenses listed below are not eligible for reimbursement. Forwarded message to managed care.

## 2013-10-29 IMAGING — CR DG CHEST 2V
2 series · 2 of 2 positions shown · non-contrast
Comparison: None.

CLINICAL DATA: Chest pain. History of hypertension and COPD.

EXAM:
CHEST  2 VIEW

[w chest lat]
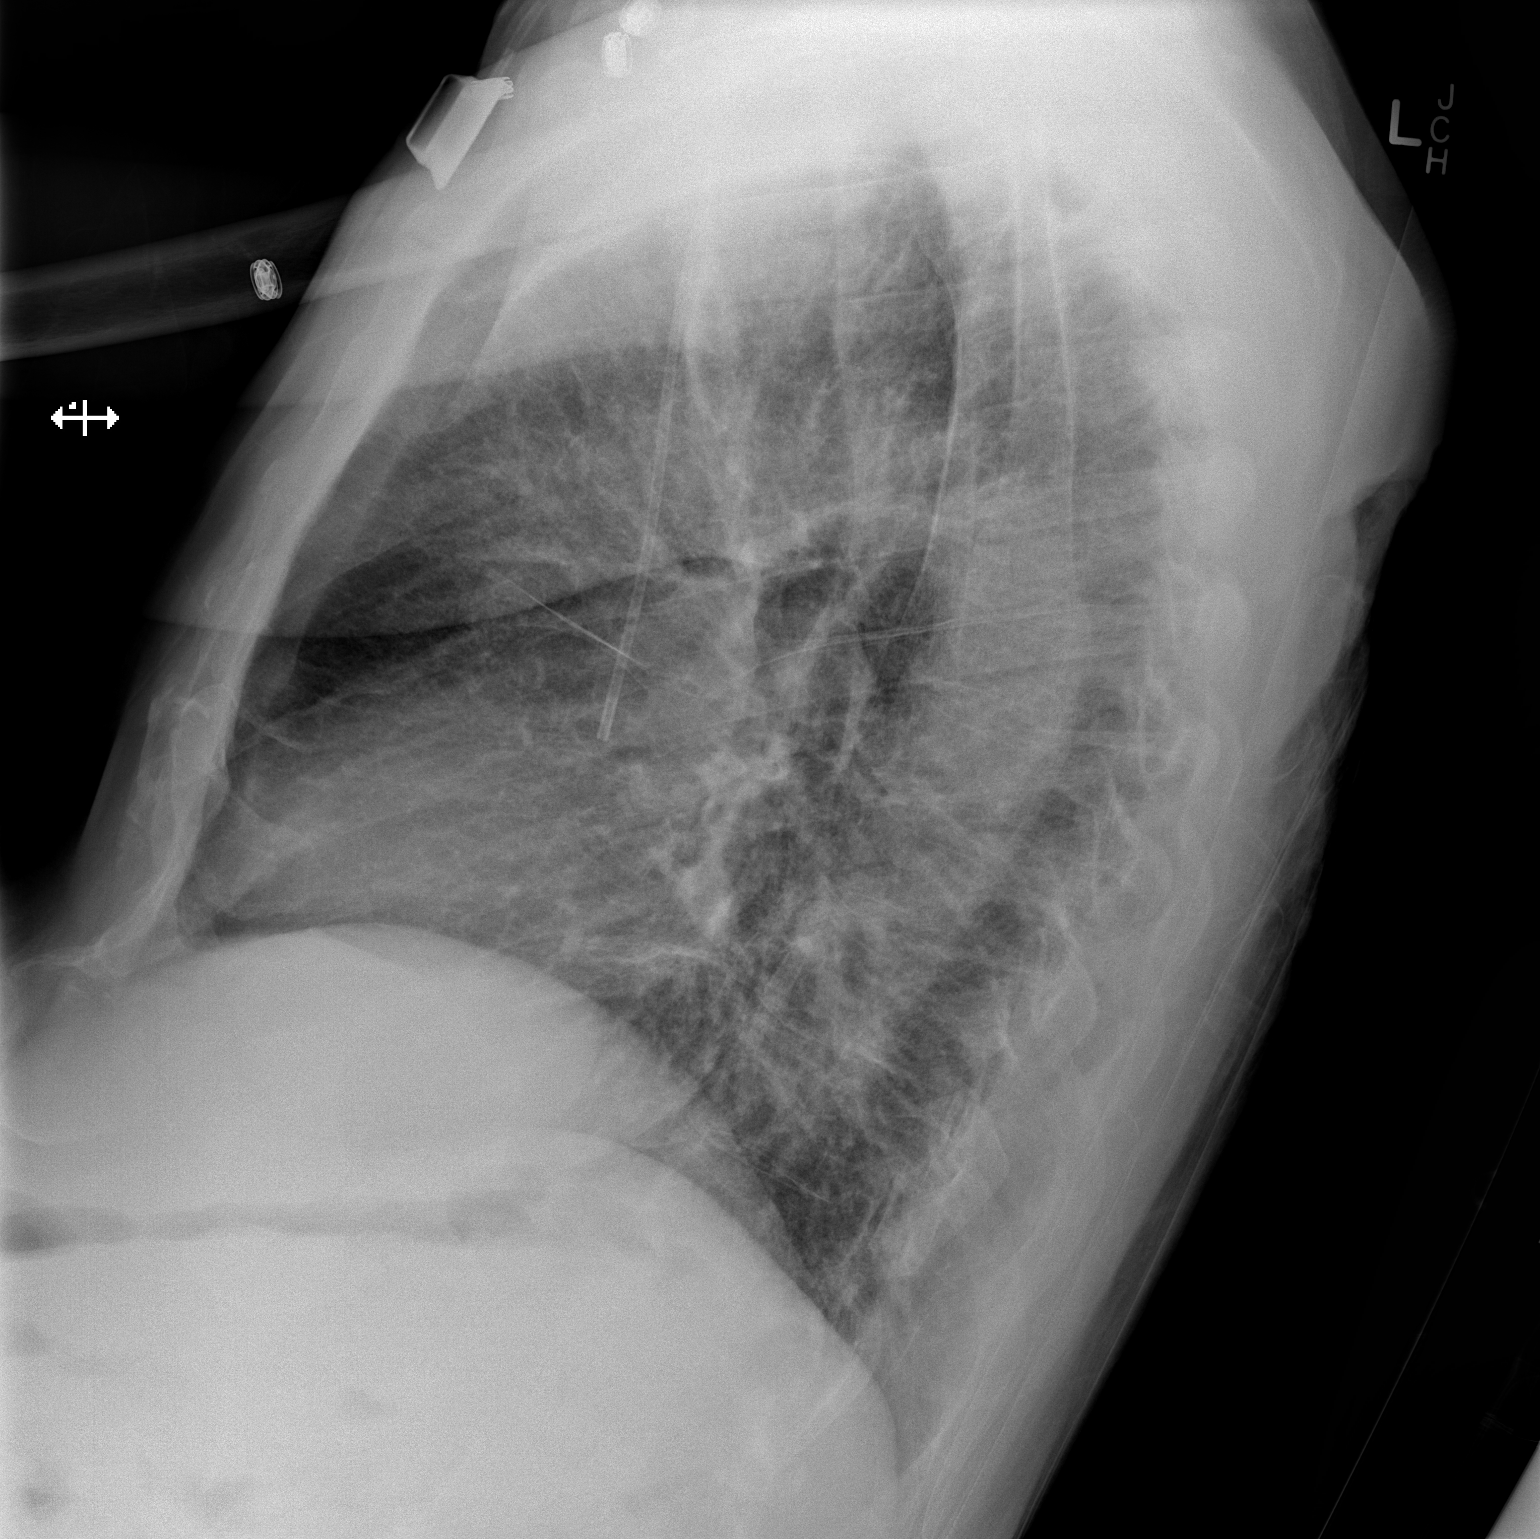

[x chest ap]
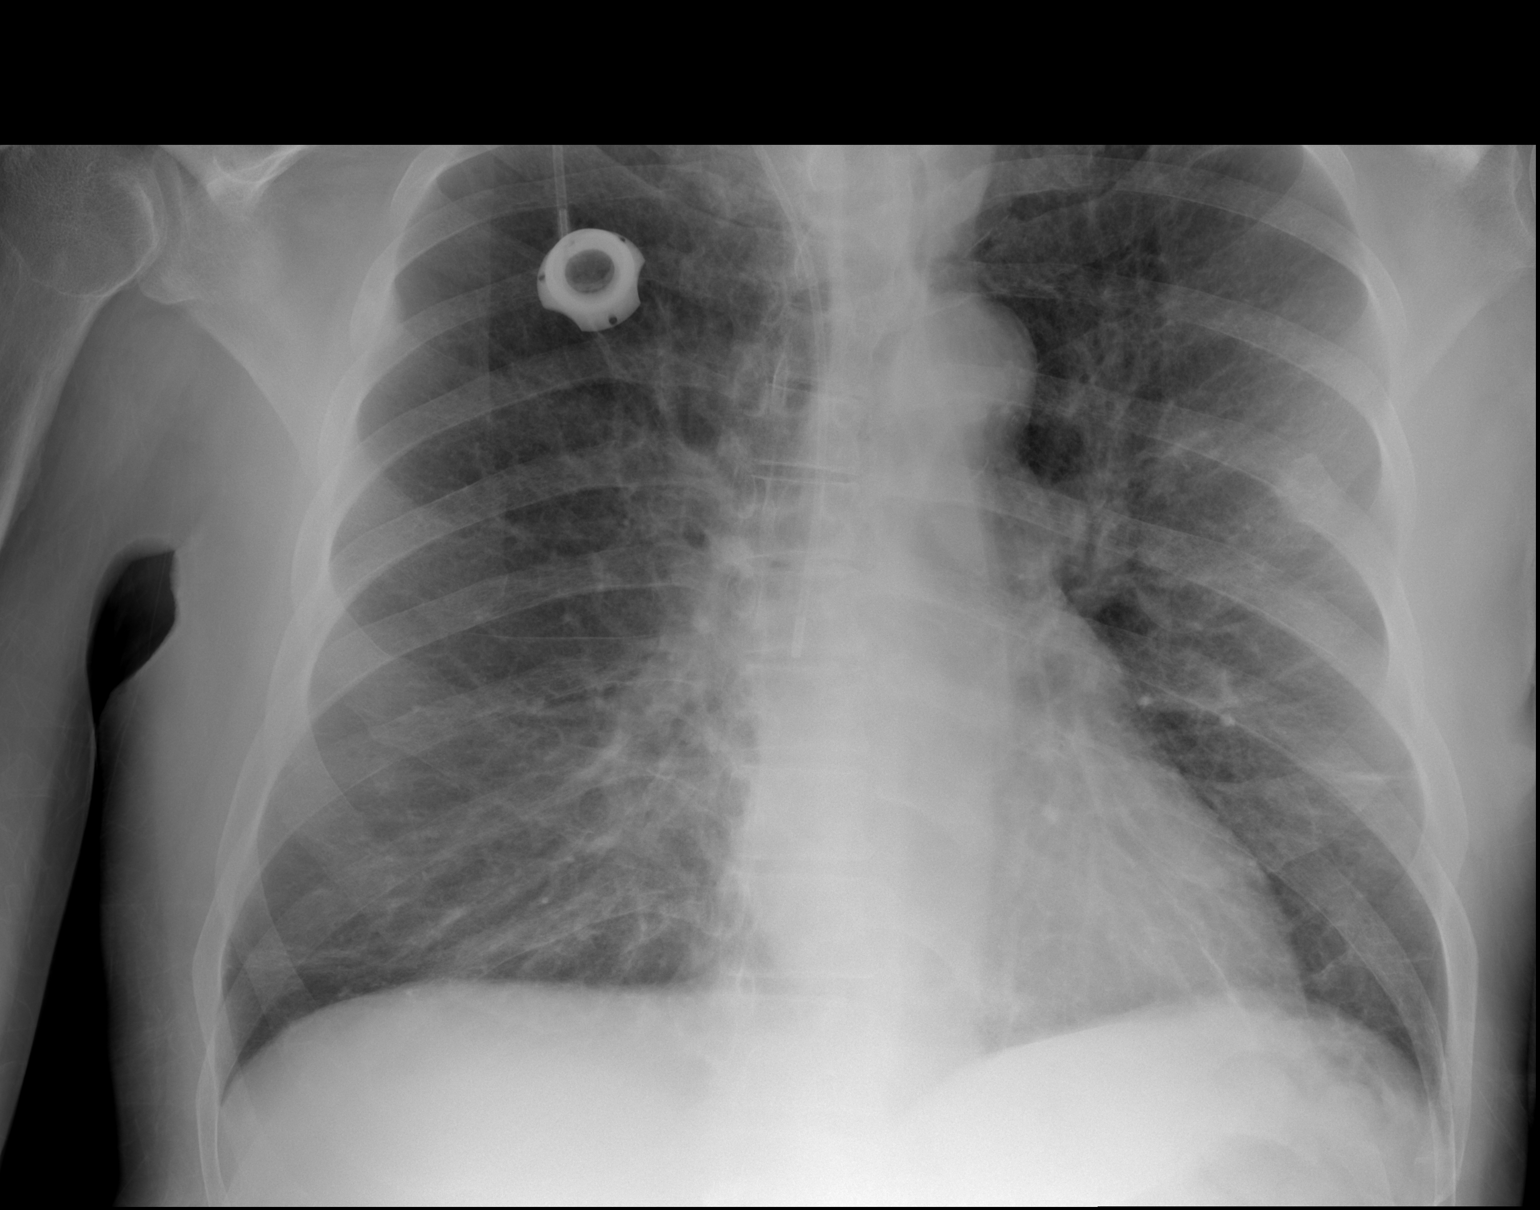

[2 of 2 positions shown; findings below may reference images not displayed]

FINDINGS: Cardiac silhouette is normal in size and configuration. No
mediastinal or hilar masses.

The lungs are mildly hyperexpanded.

There are thickened interstitial markings diffusely. This may all be
chronic. A superimposed infiltrate is not excluded, however. There
are no focal areas of airspace consolidation. No pleural effusion or
pneumothorax is seen.

Right anterior chest wall power Port-A-Cath has its tip in the lower
superior vena cava.

The bony thorax is demineralized but intact.
IMPRESSION: Diffusely thickened interstitial markings as well as lung
hyperexpansion. This may all be chronic. A superimposed acute
interstitial infiltrate is not excluded but felt less likely.

## 2013-11-03 ENCOUNTER — Encounter: Payer: Medicare Other | Admitting: Physician Assistant

## 2013-11-03 ENCOUNTER — Other Ambulatory Visit (HOSPITAL_BASED_OUTPATIENT_CLINIC_OR_DEPARTMENT_OTHER): Payer: Medicare Other

## 2013-11-03 DIAGNOSIS — C61 Malignant neoplasm of prostate: Secondary | ICD-10-CM

## 2013-11-03 DIAGNOSIS — C7951 Secondary malignant neoplasm of bone: Secondary | ICD-10-CM

## 2013-11-03 DIAGNOSIS — C7952 Secondary malignant neoplasm of bone marrow: Secondary | ICD-10-CM

## 2013-11-03 LAB — CBC WITH DIFFERENTIAL/PLATELET
BASO%: 0.3 % (ref 0.0–2.0)
BASOS ABS: 0 10*3/uL (ref 0.0–0.1)
EOS ABS: 0.1 10*3/uL (ref 0.0–0.5)
EOS%: 0.8 % (ref 0.0–7.0)
HEMATOCRIT: 32.4 % — AB (ref 38.4–49.9)
HGB: 10.6 g/dL — ABNORMAL LOW (ref 13.0–17.1)
LYMPH%: 16.6 % (ref 14.0–49.0)
MCH: 26.8 pg — ABNORMAL LOW (ref 27.2–33.4)
MCHC: 32.7 g/dL (ref 32.0–36.0)
MCV: 82 fL (ref 79.3–98.0)
MONO#: 0.8 10*3/uL (ref 0.1–0.9)
MONO%: 7.5 % (ref 0.0–14.0)
NEUT#: 7.7 10*3/uL — ABNORMAL HIGH (ref 1.5–6.5)
NEUT%: 74.8 % (ref 39.0–75.0)
Platelets: 207 10*3/uL (ref 140–400)
RBC: 3.95 10*6/uL — AB (ref 4.20–5.82)
RDW: 15.8 % — ABNORMAL HIGH (ref 11.0–14.6)
WBC: 10.3 10*3/uL (ref 4.0–10.3)
lymph#: 1.7 10*3/uL (ref 0.9–3.3)

## 2013-11-03 LAB — COMPREHENSIVE METABOLIC PANEL (CC13)
ALBUMIN: 2.9 g/dL — AB (ref 3.5–5.0)
ALK PHOS: 79 U/L (ref 40–150)
ALT: <6 U/L (ref 0–55)
ANION GAP: 12 meq/L — AB (ref 3–11)
AST: 8 U/L (ref 5–34)
BUN: 26.2 mg/dL — AB (ref 7.0–26.0)
CO2: 20 meq/L — AB (ref 22–29)
Calcium: 8.7 mg/dL (ref 8.4–10.4)
Chloride: 109 mEq/L (ref 98–109)
Creatinine: 1.8 mg/dL — ABNORMAL HIGH (ref 0.7–1.3)
GLUCOSE: 124 mg/dL (ref 70–140)
POTASSIUM: 4.1 meq/L (ref 3.5–5.1)
SODIUM: 142 meq/L (ref 136–145)
Total Bilirubin: 0.26 mg/dL (ref 0.20–1.20)
Total Protein: 6.7 g/dL (ref 6.4–8.3)

## 2013-11-04 LAB — PSA: PSA: 156.9 ng/mL — AB (ref <=4.00)

## 2013-11-05 ENCOUNTER — Ambulatory Visit (HOSPITAL_BASED_OUTPATIENT_CLINIC_OR_DEPARTMENT_OTHER): Payer: Medicare Other

## 2013-11-05 VITALS — BP 147/94 | HR 69 | Temp 99.2°F | Resp 18 | Ht 77.0 in

## 2013-11-05 DIAGNOSIS — C61 Malignant neoplasm of prostate: Secondary | ICD-10-CM

## 2013-11-05 DIAGNOSIS — C7951 Secondary malignant neoplasm of bone: Secondary | ICD-10-CM

## 2013-11-05 DIAGNOSIS — Z5111 Encounter for antineoplastic chemotherapy: Secondary | ICD-10-CM

## 2013-11-05 DIAGNOSIS — G893 Neoplasm related pain (acute) (chronic): Secondary | ICD-10-CM

## 2013-11-05 DIAGNOSIS — C7952 Secondary malignant neoplasm of bone marrow: Secondary | ICD-10-CM

## 2013-11-05 MED ORDER — DEXAMETHASONE SODIUM PHOSPHATE 10 MG/ML IJ SOLN
INTRAMUSCULAR | Status: AC
  Filled 2013-11-05: qty 1

## 2013-11-05 MED ORDER — SODIUM CHLORIDE 0.9 % IV SOLN
Freq: Once | INTRAVENOUS | Status: AC
  Administered 2013-11-05: 10:00:00 via INTRAVENOUS

## 2013-11-05 MED ORDER — SODIUM CHLORIDE 0.9 % IJ SOLN
10.0000 mL | INTRAMUSCULAR | Status: DC | PRN
  Administered 2013-11-05: 10 mL
  Filled 2013-11-05: qty 10

## 2013-11-05 MED ORDER — HEPARIN SOD (PORK) LOCK FLUSH 100 UNIT/ML IV SOLN
500.0000 [IU] | Freq: Once | INTRAVENOUS | Status: AC | PRN
  Administered 2013-11-05: 500 [IU]
  Filled 2013-11-05: qty 5

## 2013-11-05 MED ORDER — DEXAMETHASONE SODIUM PHOSPHATE 10 MG/ML IJ SOLN
10.0000 mg | Freq: Once | INTRAMUSCULAR | Status: AC
  Administered 2013-11-05: 10 mg via INTRAVENOUS

## 2013-11-05 MED ORDER — ZOLEDRONIC ACID 4 MG/100ML IV SOLN
4.0000 mg | Freq: Once | INTRAVENOUS | Status: AC
  Administered 2013-11-05: 4 mg via INTRAVENOUS
  Filled 2013-11-05: qty 100

## 2013-11-05 MED ORDER — DOCETAXEL CHEMO INJECTION 160 MG/16ML
50.0000 mg/m2 | Freq: Once | INTRAVENOUS | Status: AC
  Administered 2013-11-05: 100 mg via INTRAVENOUS
  Filled 2013-11-05: qty 10

## 2013-11-05 MED ORDER — ONDANSETRON 8 MG/NS 50 ML IVPB
INTRAVENOUS | Status: AC
  Filled 2013-11-05: qty 8

## 2013-11-05 MED ORDER — ONDANSETRON 8 MG/50ML IVPB (CHCC)
8.0000 mg | Freq: Once | INTRAVENOUS | Status: AC
  Administered 2013-11-05: 8 mg via INTRAVENOUS

## 2013-11-05 NOTE — Patient Instructions (Signed)
[facility] Discharge Instructions for Patients Receiving Chemotherapy  Today you received the following chemotherapy agents:  Taxotere  To help prevent nausea and vomiting after your treatment, we encourage you to take your nausea medication as ordered per MD.   If you develop nausea and vomiting that is not controlled by your nausea medication, call the clinic.   BELOW ARE SYMPTOMS THAT SHOULD BE REPORTED IMMEDIATELY:  *FEVER GREATER THAN 100.5 F  *CHILLS WITH OR WITHOUT FEVER  NAUSEA AND VOMITING THAT IS NOT CONTROLLED WITH YOUR NAUSEA MEDICATION  *UNUSUAL SHORTNESS OF BREATH  *UNUSUAL BRUISING OR BLEEDING  TENDERNESS IN MOUTH AND THROAT WITH OR WITHOUT PRESENCE OF ULCERS  *URINARY PROBLEMS  *BOWEL PROBLEMS  UNUSUAL RASH Items with * indicate a potential emergency and should be followed up as soon as possible.  Feel free to call the clinic you have any questions or concerns. The clinic phone number is [phone].   Zoledronic Acid injection (Hypercalcemia, Oncology) What is this medicine? ZOLEDRONIC ACID (ZOE le dron ik AS id) lowers the amount of calcium loss from bone. It is used to treat too much calcium in your blood from cancer. It is also used to prevent complications of cancer that has spread to the bone. This medicine may be used for other purposes; ask your health care provider or pharmacist if you have questions. COMMON BRAND NAME(S): Zometa What should I tell my health care provider before I take this medicine? They need to know if you have any of these conditions: -aspirin-sensitive asthma -cancer, especially if you are receiving medicines used to treat cancer -dental disease or wear dentures -infection -kidney disease -receiving corticosteroids like dexamethasone or prednisone -an unusual or allergic reaction to zoledronic acid, other medicines, foods, dyes, or preservatives -pregnant or trying to get pregnant -breast-feeding How  should I use this medicine? This medicine is for infusion into a vein. It is given by a health care professional in a hospital or clinic setting. Talk to your pediatrician regarding the use of this medicine in children. Special care may be needed. Overdosage: If you think you have taken too much of this medicine contact a poison control center or emergency room at once. NOTE: This medicine is only for you. Do not share this medicine with others. What if I miss a dose? It is important not to miss your dose. Call your doctor or health care professional if you are unable to keep an appointment. What may interact with this medicine? -certain antibiotics given by injection -NSAIDs, medicines for pain and inflammation, like ibuprofen or naproxen -some diuretics like bumetanide, furosemide -teriparatide -thalidomide This list may not describe all possible interactions. Give your health care provider a list of all the medicines, herbs, non-prescription drugs, or dietary supplements you use. Also tell them if you smoke, drink alcohol, or use illegal drugs. Some items may interact with your medicine. What should I watch for while using this medicine? Visit your doctor or health care professional for regular checkups. It may be some time before you see the benefit from this medicine. Do not stop taking your medicine unless your doctor tells you to. Your doctor may order blood tests or other tests to see how you are doing. Women should inform their doctor if they wish to become pregnant or think they might be pregnant. There is a potential for serious side effects to an unborn child. Talk to your health care professional or pharmacist for more information. You should make sure  that you get enough calcium and vitamin D while you are taking this medicine. Discuss the foods you eat and the vitamins you take with your health care professional. Some people who take this medicine have severe bone, joint, and/or  muscle pain. This medicine may also increase your risk for jaw problems or a broken thigh bone. Tell your doctor right away if you have severe pain in your jaw, bones, joints, or muscles. Tell your doctor if you have any pain that does not go away or that gets worse. Tell your dentist and dental surgeon that you are taking this medicine. You should not have major dental surgery while on this medicine. See your dentist to have a dental exam and fix any dental problems before starting this medicine. Take good care of your teeth while on this medicine. Make sure you see your dentist for regular follow-up appointments. What side effects may I notice from receiving this medicine? Side effects that you should report to your doctor or health care professional as soon as possible: -allergic reactions like skin rash, itching or hives, swelling of the face, lips, or tongue -anxiety, confusion, or depression -breathing problems -changes in vision -eye pain -feeling faint or lightheaded, falls -jaw pain, especially after dental work -mouth sores -muscle cramps, stiffness, or weakness -trouble passing urine or change in the amount of urine Side effects that usually do not require medical attention (report to your doctor or health care professional if they continue or are bothersome): -bone, joint, or muscle pain -constipation -diarrhea -fever -hair loss -irritation at site where injected -loss of appetite -nausea, vomiting -stomach upset -trouble sleeping -trouble swallowing -weak or tired This list may not describe all possible side effects. Call your doctor for medical advice about side effects. You may report side effects to FDA at 1-800-FDA-1088. Where should I keep my medicine? This drug is given in a hospital or clinic and will not be stored at home. NOTE: This sheet is a summary. It may not cover all possible information. If you have questions about this medicine, talk to your doctor,  pharmacist, or health care provider.  2014, Elsevier/Gold Standard. (2012-11-06 13:03:13)
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
## 2013-11-09 ENCOUNTER — Other Ambulatory Visit: Payer: Self-pay | Admitting: Physician Assistant

## 2013-11-09 ENCOUNTER — Telehealth: Payer: Self-pay | Admitting: Medical Oncology

## 2013-11-09 NOTE — Telephone Encounter (Signed)
Informed daughter per NP, patient will need to wait till MD returns to office Tuesday since pt walked out of appt before he could be seen. Daughter argumentative, state she feels he is being "punished" for leaving appt d/t "having incontinence problems" and per daughter pt was not feeling well to start with. Tried to explain to daughter the situation since she is requesting three different pain medications and patient has not been evaluated due to missed appt. Informed daughter when MD returns tomorrow will review the refill request.   MD inboxed.

## 2013-11-09 NOTE — Telephone Encounter (Signed)
Patient's daughter, Selena Batten requesting refills for patient of Morphine, percocet, amlodopine and dilaudid. States that pt takes 2 tablets of percocet every 4 hours and with only 60 tab dispensed this doesn't last him but 5 days. Also states that he will run out of dilaudid soon and "to save me a trip back there" asking for refill prescription of dilaudid.   Per Patient's request to be called regarding care, I called patient at home to confirm he was asking for these medication refills. Patient gave verbal confirmation.  MD out of office today. Mssg forwarded to Awilda Metro, APP for review and prescriptions.Marland Kitchen

## 2013-11-10 ENCOUNTER — Other Ambulatory Visit: Payer: Self-pay | Admitting: Medical Oncology

## 2013-11-10 DIAGNOSIS — C61 Malignant neoplasm of prostate: Secondary | ICD-10-CM

## 2013-11-10 MED ORDER — OXYCODONE-ACETAMINOPHEN 5-325 MG PO TABS: 1.0000 | ORAL_TABLET | Freq: Four times a day (QID) | ORAL | Status: DC | PRN

## 2013-11-10 MED ORDER — MORPHINE SULFATE ER 60 MG PO TBCR: 60.0000 mg | EXTENDED_RELEASE_TABLET | Freq: Three times a day (TID) | ORAL | Status: DC

## 2013-11-10 NOTE — Telephone Encounter (Signed)
Call to patient informing him of oxycodone and morphine prescription refills ready for p.u. Patient stated for me to call and let his daughter Nicollete know.  Per patient's request, LVMOM, informed daughter of prescription refills ready for p.u. As well as that MD is requesting that pt f/u with his PCP regarding amlodipine refill, and dilaudid prescription will review with pt at next appt. Daughter to call with any questions or concerns.

## 2013-11-13 ENCOUNTER — Telehealth: Payer: Self-pay | Admitting: Medical Oncology

## 2013-11-13 NOTE — Telephone Encounter (Signed)
Patient's brother, Farid Grigorian,  [place] asking to speak to Dr. Alen Blew regarding patient.  Return call to pt's brother @ [phone],  explaining to him that he is not on pt's HIPAA form and due to HIPAA laws we cannot discuss pt information with him. Mr. Burkhead expressed understanding and stated he would contact his brother regarding placing him on the HIPAA form.

## 2013-11-19 ENCOUNTER — Other Ambulatory Visit: Payer: Self-pay | Admitting: Oncology

## 2013-11-19 ENCOUNTER — Other Ambulatory Visit: Payer: Self-pay | Admitting: Internal Medicine

## 2013-11-19 NOTE — Telephone Encounter (Signed)
Patient no longer under my care - refer to Dr Alen Blew at cancer center

## 2013-11-24 ENCOUNTER — Encounter: Payer: Self-pay | Admitting: Oncology

## 2013-11-24 ENCOUNTER — Ambulatory Visit (HOSPITAL_BASED_OUTPATIENT_CLINIC_OR_DEPARTMENT_OTHER): Payer: Medicare Other | Admitting: Oncology

## 2013-11-24 ENCOUNTER — Ambulatory Visit: Payer: Medicare Other

## 2013-11-24 ENCOUNTER — Encounter: Payer: Self-pay | Admitting: Medical Oncology

## 2013-11-24 ENCOUNTER — Other Ambulatory Visit (HOSPITAL_BASED_OUTPATIENT_CLINIC_OR_DEPARTMENT_OTHER): Payer: Medicare Other

## 2013-11-24 VITALS — BP 124/65 | HR 74 | Temp 98.1°F | Resp 18 | Ht 77.0 in | Wt 184.0 lb

## 2013-11-24 DIAGNOSIS — G609 Hereditary and idiopathic neuropathy, unspecified: Secondary | ICD-10-CM

## 2013-11-24 DIAGNOSIS — C61 Malignant neoplasm of prostate: Secondary | ICD-10-CM

## 2013-11-24 DIAGNOSIS — E291 Testicular hypofunction: Secondary | ICD-10-CM

## 2013-11-24 DIAGNOSIS — C7951 Secondary malignant neoplasm of bone: Secondary | ICD-10-CM

## 2013-11-24 DIAGNOSIS — N189 Chronic kidney disease, unspecified: Secondary | ICD-10-CM

## 2013-11-24 DIAGNOSIS — C7952 Secondary malignant neoplasm of bone marrow: Secondary | ICD-10-CM

## 2013-11-24 LAB — COMPREHENSIVE METABOLIC PANEL (CC13)
ALBUMIN: 2.6 g/dL — AB (ref 3.5–5.0)
ANION GAP: 9 meq/L (ref 3–11)
AST: 8 U/L (ref 5–34)
Alkaline Phosphatase: 80 U/L (ref 40–150)
BUN: 14.9 mg/dL (ref 7.0–26.0)
CALCIUM: 8.5 mg/dL (ref 8.4–10.4)
CO2: 23 meq/L (ref 22–29)
Chloride: 110 mEq/L — ABNORMAL HIGH (ref 98–109)
Creatinine: 1.6 mg/dL — ABNORMAL HIGH (ref 0.7–1.3)
Glucose: 177 mg/dl — ABNORMAL HIGH (ref 70–140)
POTASSIUM: 3.7 meq/L (ref 3.5–5.1)
SODIUM: 142 meq/L (ref 136–145)
TOTAL PROTEIN: 6.2 g/dL — AB (ref 6.4–8.3)
Total Bilirubin: 0.23 mg/dL (ref 0.20–1.20)

## 2013-11-24 LAB — CBC WITH DIFFERENTIAL/PLATELET
BASO%: 0.7 % (ref 0.0–2.0)
Basophils Absolute: 0.1 10*3/uL (ref 0.0–0.1)
EOS ABS: 0.2 10*3/uL (ref 0.0–0.5)
EOS%: 2 % (ref 0.0–7.0)
HEMATOCRIT: 30 % — AB (ref 38.4–49.9)
HGB: 9.8 g/dL — ABNORMAL LOW (ref 13.0–17.1)
LYMPH#: 1.2 10*3/uL (ref 0.9–3.3)
LYMPH%: 14.9 % (ref 14.0–49.0)
MCH: 27.5 pg (ref 27.2–33.4)
MCHC: 32.5 g/dL (ref 32.0–36.0)
MCV: 84.4 fL (ref 79.3–98.0)
MONO#: 0.7 10*3/uL (ref 0.1–0.9)
MONO%: 9.1 % (ref 0.0–14.0)
NEUT%: 73.3 % (ref 39.0–75.0)
NEUTROS ABS: 6 10*3/uL (ref 1.5–6.5)
PLATELETS: 195 10*3/uL (ref 140–400)
RBC: 3.56 10*6/uL — AB (ref 4.20–5.82)
RDW: 16.6 % — ABNORMAL HIGH (ref 11.0–14.6)
WBC: 8.2 10*3/uL (ref 4.0–10.3)

## 2013-11-24 MED ORDER — ALPRAZOLAM 1 MG PO TABS: 0.5000 mg | ORAL_TABLET | Freq: Three times a day (TID) | ORAL | Status: AC | PRN

## 2013-11-24 MED ORDER — HYDROMORPHONE HCL 4 MG PO TABS: 8.0000 mg | ORAL_TABLET | ORAL | Status: AC | PRN

## 2013-11-24 NOTE — Progress Notes (Signed)
Per MD, referral made to [facility] and [facility]. Dr. Alen Blew to be attending and symptom mgmt to be done by [facility] physicians. Per Hospice they will contact patient later today.

## 2013-11-24 NOTE — Progress Notes (Signed)
Hematology and Oncology Follow Up Visit  Jack Barrett [id number] 11-01-1945 68 y.o. 11/24/2013 1:17 PM No PCP Per PatientNo ref. provider found   Principle Diagnosis: 68 year old gentleman with castration resistant prostate cancer with metastatic disease to the bone. He was initially diagnosed in 2007 with a Gleason score 4+5 equals 9.   Prior Therapy: He is status post radical prostatectomy with lymph node dissection 09/04/2006. Postop radiation required for local invasion. Persistent elevation of the PSA following surgery and treated with Lupron. PSA continued to rise.  He initially received Casodex and then enzalutamide. Enzalutamide was stopped for GI intolerance.  Progression in bone in May 2013. He was started on a trial of Taxotere 09/30/2012 with poor tolerance.  He subsequently relocated to [place] with family. He was started on a trial of abiraterone with low-dose prednisone August 2014. He was hospitalized September 2014 with a Port-A-Cath infection.   He did not respond to the abiraterone/prednisone with a continued rise in the PSA. Bone scan 06/23/2013 showed focal area of increased activity in the right anterior lateral fourth rib, left posterior eighth rib, sternum, right L1 vertebral body, possibly lower cervical spine.  Current therapy: Taxotere chemotherapy and 50 mg per meter square every 3 weeks starting on 08/11/2013. Last treatment given on 11/05/2013 completing 4 cycles of treatment.   Interim History:  Jack Barrett presents today for a followup visit. He is a gentleman with the above history and continues to be on chemotherapy. He have tolerated Taxotere poorly with grade 1-2 nausea but no vomiting as well as excessive fatigue and tiredness. He reports Zofran helps with these symptoms. He have also reported grade 2 painful neuropathy. His po intake have been reasonable. His performance status is poor and mobility is dependent on using a walker. He has not had any falls or  any hospitalizations recently. His pain level under reasonable control for the time being with the help of long-acting narcotics and Dilaudid for breakthrough but he does report needing at least pain medication every 4 hours. He has not reported any fevers or chills or sweats. He does report occasional episodes of unsteadiness and possible fevers although none documented. He does not report any chest pain or difficulty breathing. Is not reporting any hematochezia or melena. He does report symptoms of anxiety which he uses Xanax as needed. He also have noted pain in the buttocks area and have been diagnosed with pilonidal cysts. His overall quality of life have been poor without any improvement with systemic chemotherapy. He does not report any change in his bowel habits. Does not report any genitourinary complaints of frequency urgency or hesitancy. Does not report any petechiae or rashes. Mrs. review of systems unremarkable.  Medications: I have reviewed the patient's current medications.  Current Outpatient Prescriptions  Medication Sig Dispense Refill  . meclizine (ANTIVERT) 12.5 MG tablet Take 12.5 mg by mouth 3 (three) times daily as needed for dizziness.      . ALPRAZolam (XANAX) 1 MG tablet Take 0.5 tablets (0.5 mg total) by mouth 3 (three) times daily as needed for anxiety or sleep.  90 tablet  0  . amLODipine (NORVASC) 10 MG tablet Take 10 mg by mouth daily with breakfast.      . ammonium lactate (AMLACTIN) 12 % cream Apply 1 g topically 2 (two) times daily.      . calcium carbonate (OS-CAL) 600 MG TABS tablet Take 600 mg by mouth daily with breakfast.      . Carbomer Gel Base (  HYDROGEL) GEL 1 application by Does not apply route daily. Apply to right chest      . cholecalciferol (VITAMIN D) 1000 UNITS tablet Take 1,000 Units by mouth daily.      . collagenase (SANTYL) ointment Apply 1 application topically 2 (two) times daily. To coccyx and cover with dry dressing      . gabapentin (NEURONTIN)  300 MG capsule Take 1 capsule (300 mg total) by mouth 3 (three) times daily.  90 capsule  1  . gabapentin (NEURONTIN) 400 MG capsule Take 400 mg by mouth 2 (two) times daily.      Marland Kitchen guaiFENesin (ROBITUSSIN) 100 MG/5ML SOLN Take 5 mLs (100 mg total) by mouth every 4 (four) hours as needed for cough or to loosen phlegm.  1200 mL  1  . hydrALAZINE (APRESOLINE) 25 MG tablet Take 25 mg by mouth 3 (three) times daily.      Marland Kitchen HYDROmorphone (DILAUDID) 4 MG tablet Take 2 tablets (8 mg total) by mouth every 4 (four) hours as needed for severe pain.  240 tablet  0  . insulin aspart (NOVOLOG) 100 UNIT/ML injection Inject 5 Units into the skin 3 (three) times daily with meals.      . lidocaine-prilocaine (EMLA) cream Apply small amount to port-a-cath covering with plastic 1.5 hours before use  30 g  0  . morphine (MS CONTIN) 60 MG 12 hr tablet Take 1 tablet (60 mg total) by mouth 3 (three) times daily after meals.  90 tablet  0  . Multiple Vitamins-Minerals (DECUBI-VITE PO) Take 2 tablets by mouth daily.      . nicotine (NICODERM CQ - DOSED IN MG/24 HOURS) 14 mg/24hr patch Place 1 patch onto the skin daily.      . ondansetron (ZOFRAN) 8 MG tablet Take 1 tablet (8 mg total) by mouth 2 (two) times daily. Start the day after chemo, then as needed for nausea or vomiting.  30 tablet  1  . ondansetron (ZOFRAN-ODT) 8 MG disintegrating tablet Take 1 tablet (8 mg total) by mouth every 8 (eight) hours as needed for nausea.  60 tablet  1  . oxyCODONE-acetaminophen (PERCOCET/ROXICET) 5-325 MG per tablet Take 1-2 tablets by mouth every 6 (six) hours as needed for severe pain.  120 tablet  0  . pantoprazole (PROTONIX) 40 MG tablet Take 1 tablet (40 mg total) by mouth daily.  30 tablet  6  . polyethylene glycol (MIRALAX / GLYCOLAX) packet Take 17 g by mouth daily as needed for mild constipation.       No current facility-administered medications for this visit.     Allergies:  Allergies  Allergen Reactions  . Aspirin  Other (See Comments)    hemmoriage  . Codeine Other (See Comments)    Hallucinations; takes hydromorphone and morphine at home without issue  . Lactose Intolerance (Gi) Diarrhea    Past Medical History, Surgical history, Social history, and Family History were reviewed and updated.   Physical Exam: Blood pressure 124/65, pulse 74, temperature 98.1 F (36.7 C), temperature source Oral, resp. rate 18, height 6\' 5"  (1.956 m), weight 184 lb (83.462 kg), SpO2 98.00%. ECOG: 2 General appearance: alert and appears older than stated age Head: Normocephalic, without obvious abnormality, atraumatic Neck: no adenopathy Lymph nodes: Cervical, supraclavicular, and axillary nodes normal. Heart:regular rate and rhythm, S1, S2 normal, no murmur, click, rub or gallop Lung:chest clear, no wheezing, rales, normal symmetric air entry Abdomin: soft, non-tender, without masses or organomegaly EXT:no erythema, induration,  or nodules Skin showed no rashes or lesions.  Lab Results: Lab Results  Component Value Date   WBC 8.2 11/24/2013   HGB 9.8* 11/24/2013   HCT 30.0* 11/24/2013   MCV 84.4 11/24/2013   PLT 195 11/24/2013     Chemistry      Component Value Date/Time   NA 142 11/24/2013 1147   NA 139 06/26/2013 1313   K 3.7 11/24/2013 1147   K 3.9 06/26/2013 1313   CL 99 06/26/2013 1313   CO2 23 11/24/2013 1147   CO2 26 06/26/2013 1313   BUN 14.9 11/24/2013 1147   BUN 20 06/26/2013 1313   CREATININE 1.6* 11/24/2013 1147   CREATININE 1.09 06/26/2013 1313      Component Value Date/Time   CALCIUM 8.5 11/24/2013 1147   CALCIUM 9.2 06/26/2013 1313   ALKPHOS 80 11/24/2013 1147   ALKPHOS 120* 03/17/2013 1900   AST 8 11/24/2013 1147   AST 12 03/17/2013 1900   ALT <6 11/24/2013 1147   ALT 7 03/17/2013 1900   BILITOT 0.23 11/24/2013 1147   BILITOT 0.3 03/17/2013 1900      Results for NATE, COMMON (MRN [id number]) as of 11/24/2013 12:10  Ref. Range 08/04/2013 15:23 09/16/2013 09:35 10/13/2013 08:44 11/03/2013 14:34   PSA Latest Range: <=4.00 ng/mL 72.22 (H) 93.20 (H) 105.70 (H) 156.90 (H)    Impression and Plan:  68 year old gentleman with the following issues:  1. Castration resistant metastatic prostate cancer with disease to the bone. He is on low dose Taxotere at 50 mg per meter square every 3 weeks and his PSA continues to rise. He has poor tolerance for Taxotere with clear progression of his disease. Options of treatment discussed today including second line chemotherapy with Jevtana versus hospice care. I feel given his poor tolerance to chemotherapy and his overall decline makes him a poor candidate for any salvage chemotherapy. I think he is a good candidate for hospice and I discussed with him and his family extensively. He additionally has an incurable cancer with limited life expectancy of 6 months or less. He had not benefited clinically from palliative chemotherapy and I would think he would benefit from hospice enrollment for symptom management. He and his family are agreeable and we'll make the appropriate arrangements  2. Androgen depravation: He will suspended Lupron injections for the time being  3. Bone health:  This will be stopped for the time being as we transition to palliative and supportive care only.  4. Chronic renal insufficiency: His last creatinine is 1.8 which close to his baseline.  5. Bone pain: He is currently on long-acting narcotic in the form of MS Contin at 60 mg every 8hours with breakthrough Dilaudid and seems to be under reasonable control. I have refilled his Dilaudid today.  6. Peripheral neuropathy: on gabapentin 300 mg to use 3 times a day as needed.   7. Anxiety: He does use Xanax 2-3 times a day for both anxiety and sleep. I have refilled this medication for him today with clear instructions and complications associated with this medicine.  8. Pilonidal cyst: The seems to have improved and becoming less of an issue for him to  9. Followup: Will be in 4  weeks to followup on his clinical status.   Zola Button, MD 6/16/20151:17 PM

## 2013-11-25 ENCOUNTER — Telehealth: Payer: Self-pay | Admitting: Medical Oncology

## 2013-11-25 ENCOUNTER — Telehealth: Payer: Self-pay | Admitting: Oncology

## 2013-11-25 LAB — PSA: PSA: 167.7 ng/mL — ABNORMAL HIGH (ref <=4.00)

## 2013-11-25 NOTE — Telephone Encounter (Signed)
s.w. pt and advised on July appt....pt ok and aware °

## 2013-11-25 NOTE — Telephone Encounter (Signed)
Patient LVMOM requesting results to PSA level from 06/16. Return call to patient giving him results to PSA. Patient expressed thanks, no further questions at this time.

## 2013-11-27 ENCOUNTER — Other Ambulatory Visit: Payer: Self-pay | Admitting: *Deleted

## 2013-11-27 DIAGNOSIS — I10 Essential (primary) hypertension: Secondary | ICD-10-CM

## 2013-11-27 DIAGNOSIS — C61 Malignant neoplasm of prostate: Secondary | ICD-10-CM

## 2013-11-27 MED ORDER — AMLODIPINE BESYLATE 5 MG PO TABS: 5.0000 mg | ORAL_TABLET | Freq: Every day | ORAL | Status: AC

## 2013-11-27 NOTE — Telephone Encounter (Signed)
Received fax request for patient rx of Amlodipine 5mg  tabs. According to our records and dictations, it was shown patient was on Amlodipine 10mg . Called and spoke directly to patient, he is reading the directions from a bottle of Amlodipine 5mg  tabs. This is what we will refill at this time since he states that is what he has been taking. Will notify hospice to monitor b/p and call us if issues with hypertension.

## 2013-12-01 ENCOUNTER — Encounter (INDEPENDENT_AMBULATORY_CARE_PROVIDER_SITE_OTHER): Payer: Medicare Other | Admitting: General Surgery

## 2013-12-03 ENCOUNTER — Telehealth: Payer: Self-pay | Admitting: *Deleted

## 2013-12-03 NOTE — Telephone Encounter (Signed)
HOSPICE PHYSICIAN WILL SIGN THE VERBAL ORDER FROM DR.SHADAD. VERBAL ORDER AND READ BACK TO DR.SHADAD PT. IS A DNR. NOTIFIED PATTY. SHE VOICES UNDERSTANDING. PT. WILL BE ADMITTED TO [facility] FOR SYMPTOM MANAGEMENT OF HIS VOMITING AND PAIN. THIS NOTE TO DR.SHADAD'S ACTIVE WORK FOLDER.

## 2013-12-14 ENCOUNTER — Encounter: Payer: Self-pay | Admitting: *Deleted

## 2013-12-14 NOTE — Progress Notes (Signed)
RECEIVED A FAX FROM [facility] CONCERNING A PRIOR AUTHORIZATION FOR ALPRAZOLAM. THIS REQUEST WAS PLACED IN THE MANAGED CARE BIN.

## 2013-12-17 ENCOUNTER — Encounter (HOSPITAL_COMMUNITY): Payer: Self-pay | Admitting: Emergency Medicine

## 2013-12-17 ENCOUNTER — Emergency Department (HOSPITAL_COMMUNITY)
Admission: EM | Admit: 2013-12-17 | Discharge: 2013-12-18 | Disposition: A | Discharge status: Home / Self Care | Payer: Medicare Other | Attending: Emergency Medicine | Admitting: Emergency Medicine

## 2013-12-17 ENCOUNTER — Telehealth: Payer: Self-pay | Admitting: *Deleted

## 2013-12-17 DIAGNOSIS — G803 Athetoid cerebral palsy: Secondary | ICD-10-CM | POA: Insufficient documentation

## 2013-12-17 DIAGNOSIS — Z79899 Other long term (current) drug therapy: Secondary | ICD-10-CM | POA: Insufficient documentation

## 2013-12-17 DIAGNOSIS — Z872 Personal history of diseases of the skin and subcutaneous tissue: Secondary | ICD-10-CM | POA: Insufficient documentation

## 2013-12-17 DIAGNOSIS — F17200 Nicotine dependence, unspecified, uncomplicated: Secondary | ICD-10-CM | POA: Insufficient documentation

## 2013-12-17 DIAGNOSIS — Z9221 Personal history of antineoplastic chemotherapy: Secondary | ICD-10-CM | POA: Diagnosis not present

## 2013-12-17 DIAGNOSIS — E1149 Type 2 diabetes mellitus with other diabetic neurological complication: Secondary | ICD-10-CM | POA: Insufficient documentation

## 2013-12-17 DIAGNOSIS — F341 Dysthymic disorder: Secondary | ICD-10-CM | POA: Diagnosis not present

## 2013-12-17 DIAGNOSIS — G909 Disorder of the autonomic nervous system, unspecified: Secondary | ICD-10-CM | POA: Insufficient documentation

## 2013-12-17 DIAGNOSIS — Z8546 Personal history of malignant neoplasm of prostate: Secondary | ICD-10-CM | POA: Insufficient documentation

## 2013-12-17 DIAGNOSIS — I10 Essential (primary) hypertension: Secondary | ICD-10-CM | POA: Diagnosis not present

## 2013-12-17 DIAGNOSIS — J449 Chronic obstructive pulmonary disease, unspecified: Secondary | ICD-10-CM | POA: Insufficient documentation

## 2013-12-17 DIAGNOSIS — J4489 Other specified chronic obstructive pulmonary disease: Secondary | ICD-10-CM | POA: Insufficient documentation

## 2013-12-17 DIAGNOSIS — R112 Nausea with vomiting, unspecified: Secondary | ICD-10-CM | POA: Insufficient documentation

## 2013-12-17 LAB — I-STAT CG4 LACTIC ACID, ED: Lactic Acid, Venous: 1.42 mmol/L (ref 0.5–2.2)

## 2013-12-17 LAB — COMPREHENSIVE METABOLIC PANEL
ALT: <5 U/L (ref 0–53)
AST: 10 U/L (ref 0–37)
Albumin: 2.9 g/dL — ABNORMAL LOW (ref 3.5–5.2)
Alkaline Phosphatase: 79 U/L (ref 39–117)
Anion gap: 14 (ref 5–15)
BUN: 12 mg/dL (ref 6–23)
CO2: 22 mEq/L (ref 19–32)
Calcium: 8.7 mg/dL (ref 8.4–10.5)
Chloride: 103 mEq/L (ref 96–112)
Creatinine, Ser: 1.29 mg/dL (ref 0.50–1.35)
GFR calc Af Amer: 66 mL/min — ABNORMAL LOW (ref >90)
GFR calc non Af Amer: 57 mL/min — ABNORMAL LOW (ref >90)
Glucose, Bld: 90 mg/dL (ref 70–99)
Potassium: 3.6 mEq/L — ABNORMAL LOW (ref 3.7–5.3)
Sodium: 139 mEq/L (ref 137–147)
Total Bilirubin: 0.3 mg/dL (ref 0.3–1.2)
Total Protein: 6.2 g/dL (ref 6.0–8.3)

## 2013-12-17 LAB — CBC WITH DIFFERENTIAL/PLATELET
Basophils Absolute: 0 10*3/uL (ref 0.0–0.1)
Basophils Relative: 1 % (ref 0–1)
Eosinophils Absolute: 0.2 10*3/uL (ref 0.0–0.7)
Eosinophils Relative: 4 % (ref 0–5)
HCT: 28.7 % — ABNORMAL LOW (ref 39.0–52.0)
Hemoglobin: 9.4 g/dL — ABNORMAL LOW (ref 13.0–17.0)
Lymphocytes Relative: 24 % (ref 12–46)
Lymphs Abs: 1.5 10*3/uL (ref 0.7–4.0)
MCH: 27.2 pg (ref 26.0–34.0)
MCHC: 32.8 g/dL (ref 30.0–36.0)
MCV: 83.2 fL (ref 78.0–100.0)
Monocytes Absolute: 0.5 10*3/uL (ref 0.1–1.0)
Monocytes Relative: 8 % (ref 3–12)
Neutro Abs: 3.8 10*3/uL (ref 1.7–7.7)
Neutrophils Relative %: 63 % (ref 43–77)
Platelets: 189 10*3/uL (ref 150–400)
RBC: 3.45 MIL/uL — ABNORMAL LOW (ref 4.22–5.81)
RDW: 16.2 % — ABNORMAL HIGH (ref 11.5–15.5)
WBC: 6 10*3/uL (ref 4.0–10.5)

## 2013-12-17 LAB — LIPASE, BLOOD: Lipase: 17 U/L (ref 11–59)

## 2013-12-17 MED ORDER — ONDANSETRON HCL 4 MG/2ML IJ SOLN: 4.0000 mg | Freq: Once | INTRAMUSCULAR | Status: DC

## 2013-12-17 MED ORDER — MORPHINE SULFATE 4 MG/ML IJ SOLN
6.0000 mg | Freq: Once | INTRAMUSCULAR | Status: AC
  Administered 2013-12-17: 6 mg via INTRAVENOUS
  Filled 2013-12-17: qty 2

## 2013-12-17 MED ORDER — SODIUM CHLORIDE 0.9 % IV BOLUS (SEPSIS)
1000.0000 mL | Freq: Once | INTRAVENOUS | Status: AC
  Administered 2013-12-17: 1000 mL via INTRAVENOUS

## 2013-12-17 NOTE — Progress Notes (Signed)
  CARE MANAGEMENT ED NOTE 12/17/2013  Patient:  Jack Barrett, Jack Barrett   Account Number:  [id number]  Date Initiated:  12/17/2013  Documentation initiated by:  Livia Snellen  Subjective/Objective Assessment:   Patient presents to Ed with n/v since 07/04     Subjective/Objective Assessment Detail:   Patient with history of prostate cancer with METS.     Action/Plan:   Action/Plan Detail:   Anticipated DC Date:       Status Recommendation to Physician:   Result of Recommendation:    Other ED [place]  Other  PCP issues    Choice offered to / List presented to:            Status of service:  Completed, signed off  ED Comments:   ED Comments Detail:  EDCM spoke to patient at bedside.  No family at bedside currently.  Patient unsure of his pcp name.  Patient reports Dr. Jimmye Norman off of [place]?  As per chart review, patient is  seen at [facility] cancer center by Dr. Alen Blew. Patient is a hospice patient, currently receiving home hospice services with [facility] [place].  EDCM spoke to [facility] who reports patient has been noncompliant with his phenergan suppositories.  As per Tiffany, "He is supposed to give them to himself around the clock."Tiffany reports patient is able to give the suppositories to himself.  Hospice RN accessed patient's port and started IV fluids.  No further EDCM needs at this time.

## 2013-12-17 NOTE — ED Notes (Signed)
Mingo Amber EDP made aware of CG4 Lactic results.

## 2013-12-17 NOTE — ED Notes (Signed)
Pt made aware we need urine sample, however he sts he is incontinent, and will not allow in and out due to hx of prostate cancer and prostate surgery.

## 2013-12-17 NOTE — ED Notes (Signed)
Pt came in with left chest port already accessed and fluids running.

## 2013-12-17 NOTE — ED Notes (Signed)
Pt from home via [facility] with c/o nausea and vomiting since 7/4. Pt is hospice pt with hx of prostate cancer with mets.

## 2013-12-17 NOTE — ED Notes (Signed)
Bed: UY23 Expected date:  Expected time:  Means of arrival:  Comments: EMS- 68yo M, n/v x 3 days

## 2013-12-17 NOTE — ED Notes (Signed)
Pt given saltine crackers and sprite, per PA Chris. Pt denies nausea, sts crackers are helping "settle my stomach".

## 2013-12-17 NOTE — Telephone Encounter (Signed)
Per norma, hospice nurse. Patient has been having n/v since 12/12/13, after  leaving [facility]. She feels that medications were not being given, around the clock. They plan to take him to the E.R. Dr Alen Blew aware.

## 2013-12-17 NOTE — ED Provider Notes (Signed)
CSN: [id number]     Arrival date & time 12/17/13  1724 History   First MD Initiated Contact with Patient 12/17/13 1729     Chief Complaint  Patient presents with  . Nausea  . Emesis     (Consider location/radiation/quality/duration/timing/severity/associated sxs/prior Treatment) HPI Patient presents to the emergency department with chronic vomiting, that started about one month ago.  The patient, states he is nausea and vomiting, that this occurs in the morning.  The patient, states, that he's been very weak.  For his chemotherapy, the patient denies chest pain, shortness of breath, headache, blurred vision, back pain, fever, rash, cough, abdominal pain, or syncope.  The patient, states, that nothing seems to make his condition, better or worse.  Patient, states, that he has rectal suppository Phenergan.  Patient, states, that his symptoms have been constant Past Medical History  Diagnosis Date  . HTN (hypertension), benign 01/15/2013  . COPD (chronic obstructive pulmonary disease) 01/15/2013  . IDDM (insulin dependent diabetes mellitus) 01/15/2013  . 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 Cancer associated pain 01/15/2013  . Status post right hip replacement 01/15/2013  . Chronic ulcer of gastric fundus 01/15/2013    EGD 11/24/12  . Depression, reactive 01/15/2013  . Decubitus ulcer   . 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 Total hip arthroplasty    . Prostate surgery    . [place] cath    . Skin grafts    . Port-a-cath removal Right 03/09/2013    Procedure: REMOVAL PORT-A-CATH;  Surgeon: Merrie Roof, MD;  Location: [facility];  Service: General;  Laterality: Right;  . Portacath placement Left 06/26/2013    Dr. Barbie Banner tip in SVC/RA   History reviewed. No pertinent family history. History  Substance Use Topics  . Smoking status: Current Every Day Smoker -- 1.00 packs/day    Types:  Cigarettes  . Smokeless tobacco: Never Used  . Alcohol Use: No    Review of Systems   All other systems negative except as documented in the HPI. All pertinent positives and negatives as reviewed in the HPI. Allergies  Aspirin; Codeine; and Lactose intolerance (gi)  Home Medications   Prior to Admission medications   Medication Sig Start Date End Date Taking? Authorizing Provider  ALPRAZolam Duanne Moron) 1 MG tablet Take 0.5 tablets (0.5 mg total) by mouth 3 (three) times daily as needed for anxiety or sleep. 11/24/13   Wyatt Portela, MD  amLODipine (NORVASC) 5 MG tablet Take 1 tablet (5 mg total) by mouth daily. 11/27/13   Wyatt Portela, MD  ammonium lactate (AMLACTIN) 12 % cream Apply 1 g topically 2 (two) times daily.    Historical Provider, MD  calcium carbonate (OS-CAL) 600 MG TABS tablet Take 600 mg by mouth daily with breakfast.    Historical Provider, MD  Carbomer Gel Base (HYDROGEL) GEL 1 application by Does not apply route daily. Apply to right chest    Historical Provider, MD  cholecalciferol (VITAMIN D) 1000 UNITS tablet Take 1,000 Units by mouth daily.    Historical Provider, MD  collagenase (SANTYL) ointment Apply 1 application topically 2 (two) times daily. To coccyx and cover with dry dressing    Historical Provider, MD  gabapentin (NEURONTIN) 300 MG capsule Take 1 capsule (300 mg total) by mouth 3 (three) times daily. 09/16/13   Wyatt Portela, MD  gabapentin (NEURONTIN) 400 MG capsule  Take 400 mg by mouth 2 (two) times daily.    Historical Provider, MD  guaiFENesin (ROBITUSSIN) 100 MG/5ML SOLN Take 5 mLs (100 mg total) by mouth every 4 (four) hours as needed for cough or to loosen phlegm. 07/20/13   Annia Belt, MD  hydrALAZINE (APRESOLINE) 25 MG tablet Take 25 mg by mouth 3 (three) times daily.    Historical Provider, MD  HYDROmorphone (DILAUDID) 4 MG tablet Take 2 tablets (8 mg total) by mouth every 4 (four) hours as needed for severe pain. 11/24/13   Wyatt Portela, MD   insulin aspart (NOVOLOG) 100 UNIT/ML injection Inject 5 Units into the skin 3 (three) times daily with meals. 03/20/13   Venetia Maxon Rama, MD  lidocaine-prilocaine (EMLA) cream Apply small amount to port-a-cath covering with plastic 1.5 hours before use 10/13/13   Wyatt Portela, MD  meclizine (ANTIVERT) 12.5 MG tablet Take 12.5 mg by mouth 3 (three) times daily as needed for dizziness.    Historical Provider, MD  morphine (MS CONTIN) 60 MG 12 hr tablet Take 1 tablet (60 mg total) by mouth 3 (three) times daily after meals. 11/10/13   Wyatt Portela, MD  Multiple Vitamins-Minerals (DECUBI-VITE PO) Take 2 tablets by mouth daily.    Historical Provider, MD  nicotine (NICODERM CQ - DOSED IN MG/24 HOURS) 14 mg/24hr patch Place 1 patch onto the skin daily. 03/20/13   Christina P Rama, MD  ondansetron (ZOFRAN) 8 MG tablet Take 1 tablet (8 mg total) by mouth 2 (two) times daily. Start the day after chemo, then as needed for nausea or vomiting. 08/12/13   Annia Belt, MD  ondansetron (ZOFRAN-ODT) 8 MG disintegrating tablet Take 1 tablet (8 mg total) by mouth every 8 (eight) hours as needed for nausea. 10/09/13   Wyatt Portela, MD  oxyCODONE-acetaminophen (PERCOCET/ROXICET) 5-325 MG per tablet Take 1-2 tablets by mouth every 6 (six) hours as needed for severe pain. 11/10/13   Wyatt Portela, MD  pantoprazole (PROTONIX) 40 MG tablet Take 1 tablet (40 mg total) by mouth daily. 08/11/13   Annia Belt, MD  polyethylene glycol (MIRALAX / Floria Raveling) packet Take 17 g by mouth daily as needed for mild constipation. 03/20/13   Christina P Rama, MD   BP 140/80  Pulse 63  Temp(Src) 98.9 F (37.2 C) (Oral)  Resp 18  SpO2 98% Physical Exam  Nursing note and vitals reviewed. Constitutional: He is oriented to person, place, and time. He appears well-developed and well-nourished. No distress.  HENT:  Head: Normocephalic and atraumatic.  Mouth/Throat: Oropharynx is clear and moist.  Eyes: Pupils are equal,  round, and reactive to light.  Neck: Normal range of motion. Neck supple.  Cardiovascular: Normal rate, regular rhythm and normal heart sounds.  Exam reveals no gallop and no friction rub.   No murmur heard. Pulmonary/Chest: Effort normal and breath sounds normal. No respiratory distress.  Abdominal: Soft. Bowel sounds are normal. He exhibits no distension. There is no tenderness. There is no rebound and no guarding.  Neurological: He is alert and oriented to person, place, and time. He exhibits normal muscle tone. Coordination normal.  Skin: Skin is warm and dry. No rash noted. No erythema.    ED Course  Procedures (including critical care time) Labs Review Labs Reviewed  CBC WITH DIFFERENTIAL - Abnormal; Notable for the following:    RBC 3.45 (*)    Hemoglobin 9.4 (*)    HCT 28.7 (*)    RDW  16.2 (*)    All other components within normal limits  COMPREHENSIVE METABOLIC PANEL - Abnormal; Notable for the following:    Potassium 3.6 (*)    Albumin 2.9 (*)    GFR calc non Af Amer 57 (*)    GFR calc Af Amer 66 (*)    All other components within normal limits  URINALYSIS, ROUTINE W REFLEX MICROSCOPIC - Abnormal; Notable for the following:    Protein, ur 100 (*)    All other components within normal limits  URINE MICROSCOPIC-ADD ON - Abnormal; Notable for the following:    Casts HYALINE CASTS (*)    All other components within normal limits  URINE CULTURE  LIPASE, BLOOD  I-STAT CG4 LACTIC ACID, ED    The patient will be treated for his symptoms and referred back to his primary care Dr. told to return here as needed.  Patient is stable here in the emergency department.  His vital signs been stable as well.  This is most likely related to his treatments for cancer    Brent General, PA-C 12/18/13 [phone]

## 2013-12-17 NOTE — ED Notes (Signed)
[place] [phone]

## 2013-12-18 ENCOUNTER — Other Ambulatory Visit: Payer: Self-pay | Admitting: Oncology

## 2013-12-18 DIAGNOSIS — R112 Nausea with vomiting, unspecified: Secondary | ICD-10-CM | POA: Diagnosis not present

## 2013-12-18 LAB — URINE MICROSCOPIC-ADD ON

## 2013-12-18 LAB — URINALYSIS, ROUTINE W REFLEX MICROSCOPIC
Bilirubin Urine: NEGATIVE
Glucose, UA: NEGATIVE mg/dL
Hgb urine dipstick: NEGATIVE
Ketones, ur: NEGATIVE mg/dL
Leukocytes, UA: NEGATIVE
Nitrite: NEGATIVE
Protein, ur: 100 mg/dL — AB
Specific Gravity, Urine: 1.012 (ref 1.005–1.030)
Urobilinogen, UA: 1 mg/dL (ref 0.0–1.0)
pH: 6 (ref 5.0–8.0)

## 2013-12-18 LAB — URINE CULTURE: Colony Count: 75000

## 2013-12-18 MED ORDER — SUCRALFATE 1 G PO TABS: 1.0000 g | ORAL_TABLET | Freq: Three times a day (TID) | ORAL | Status: AC

## 2013-12-18 MED ORDER — HEPARIN SOD (PORK) LOCK FLUSH 100 UNIT/ML IV SOLN
500.0000 [IU] | Freq: Once | INTRAVENOUS | Status: AC
  Administered 2013-12-18: 500 [IU]
  Filled 2013-12-18: qty 5

## 2013-12-18 MED ORDER — ONDANSETRON HCL 4 MG PO TABS: 4.0000 mg | ORAL_TABLET | Freq: Four times a day (QID) | ORAL | Status: AC

## 2013-12-18 NOTE — ED Notes (Signed)
[facility] called for transport.  

## 2013-12-18 NOTE — Discharge Instructions (Signed)
Return here as needed.  Followup with your primary care Dr. increase your fluid intake °

## 2013-12-19 NOTE — ED Provider Notes (Signed)
Medical screening examination/treatment/procedure(s) were performed by non-physician practitioner and as supervising physician I was immediately available for consultation/collaboration.   EKG Interpretation None        Osvaldo Shipper, MD 12/19/13 1128

## 2013-12-21 ENCOUNTER — Encounter (INDEPENDENT_AMBULATORY_CARE_PROVIDER_SITE_OTHER): Payer: Medicare Other | Admitting: General Surgery

## 2013-12-22 ENCOUNTER — Telehealth: Payer: Self-pay | Admitting: Medical Oncology

## 2013-12-22 NOTE — Telephone Encounter (Signed)
Constance Holster, RN, with [facility] reporting patient requesting to have a f/c placed. Constance Holster requesting MD advice.  Review with MD, LVMOM with Constance Holster that MD sees no contraindication and ok for patient to have f/c. Hospice to place f/c and call office should they have any further questions or concerns.

## 2013-12-23 ENCOUNTER — Ambulatory Visit (HOSPITAL_BASED_OUTPATIENT_CLINIC_OR_DEPARTMENT_OTHER): Payer: Medicare Other | Admitting: Physician Assistant

## 2013-12-23 ENCOUNTER — Other Ambulatory Visit (HOSPITAL_BASED_OUTPATIENT_CLINIC_OR_DEPARTMENT_OTHER)

## 2013-12-23 VITALS — BP 158/70 | HR 78 | Temp 98.4°F | Resp 18 | Ht 77.0 in | Wt 175.4 lb

## 2013-12-23 DIAGNOSIS — C61 Malignant neoplasm of prostate: Secondary | ICD-10-CM

## 2013-12-23 DIAGNOSIS — C7952 Secondary malignant neoplasm of bone marrow: Secondary | ICD-10-CM

## 2013-12-23 DIAGNOSIS — N189 Chronic kidney disease, unspecified: Secondary | ICD-10-CM

## 2013-12-23 DIAGNOSIS — R059 Cough, unspecified: Secondary | ICD-10-CM

## 2013-12-23 DIAGNOSIS — C7951 Secondary malignant neoplasm of bone: Secondary | ICD-10-CM

## 2013-12-23 DIAGNOSIS — R05 Cough: Secondary | ICD-10-CM

## 2013-12-23 DIAGNOSIS — E291 Testicular hypofunction: Secondary | ICD-10-CM

## 2013-12-23 DIAGNOSIS — G609 Hereditary and idiopathic neuropathy, unspecified: Secondary | ICD-10-CM

## 2013-12-23 DIAGNOSIS — F411 Generalized anxiety disorder: Secondary | ICD-10-CM

## 2013-12-23 LAB — CBC WITH DIFFERENTIAL/PLATELET
BASO%: 0.7 % (ref 0.0–2.0)
Basophils Absolute: 0.1 10*3/uL (ref 0.0–0.1)
EOS ABS: 0.5 10*3/uL (ref 0.0–0.5)
EOS%: 6.8 % (ref 0.0–7.0)
HCT: 31.1 % — ABNORMAL LOW (ref 38.4–49.9)
HGB: 10 g/dL — ABNORMAL LOW (ref 13.0–17.1)
LYMPH#: 1.3 10*3/uL (ref 0.9–3.3)
LYMPH%: 15.7 % (ref 14.0–49.0)
MCH: 27.2 pg (ref 27.2–33.4)
MCHC: 32.1 g/dL (ref 32.0–36.0)
MCV: 84.7 fL (ref 79.3–98.0)
MONO#: 0.5 10*3/uL (ref 0.1–0.9)
MONO%: 6 % (ref 0.0–14.0)
NEUT%: 70.8 % (ref 39.0–75.0)
NEUTROS ABS: 5.7 10*3/uL (ref 1.5–6.5)
PLATELETS: 187 10*3/uL (ref 140–400)
RBC: 3.67 10*6/uL — AB (ref 4.20–5.82)
RDW: 17.3 % — ABNORMAL HIGH (ref 11.0–14.6)
WBC: 8 10*3/uL (ref 4.0–10.3)

## 2013-12-23 LAB — COMPREHENSIVE METABOLIC PANEL (CC13)
ALT: 7 U/L (ref 0–55)
ANION GAP: 11 meq/L (ref 3–11)
AST: 14 U/L (ref 5–34)
Albumin: 3 g/dL — ABNORMAL LOW (ref 3.5–5.0)
Alkaline Phosphatase: 81 U/L (ref 40–150)
BUN: 21 mg/dL (ref 7.0–26.0)
CALCIUM: 8.8 mg/dL (ref 8.4–10.4)
CHLORIDE: 106 meq/L (ref 98–109)
CO2: 21 mEq/L — ABNORMAL LOW (ref 22–29)
Creatinine: 2 mg/dL — ABNORMAL HIGH (ref 0.7–1.3)
GLUCOSE: 166 mg/dL — AB (ref 70–140)
Potassium: 4.3 mEq/L (ref 3.5–5.1)
SODIUM: 138 meq/L (ref 136–145)
TOTAL PROTEIN: 6.7 g/dL (ref 6.4–8.3)

## 2013-12-23 MED ORDER — GUAIFENESIN-CODEINE 100-10 MG/5ML PO SYRP: 5.0000 mL | ORAL_SOLUTION | Freq: Three times a day (TID) | ORAL | Status: AC | PRN

## 2013-12-25 ENCOUNTER — Telehealth: Payer: Self-pay | Admitting: Medical Oncology

## 2013-12-25 NOTE — Telephone Encounter (Signed)
Deborah Chalk, nurse with [facility] called to clarify patient's medications, states some discrepancy between what is listed and what family say patient is taking. Will return call should she have further questions.

## 2013-12-28 NOTE — Patient Instructions (Signed)
Continue with hospice care Start with a condom catheter for urine incontinence. Ultimately a Foley catheter may need to be placed

## 2013-12-28 NOTE — Progress Notes (Signed)
Hematology and Oncology Follow Up Visit  Jack Barrett [id number] 1946/01/01 68 y.o. 12/28/2013 9:31 AM No PCP Per PatientNo ref. provider found   Principle Diagnosis: 68 year old gentleman with castration resistant prostate cancer with metastatic disease to the bone. He was initially diagnosed in 2007 with a Gleason score 4+5 equals 9.   Prior Therapy: He is status post radical prostatectomy with lymph node dissection 09/04/2006. Postop radiation required for local invasion. Persistent elevation of the PSA following surgery and treated with Lupron. PSA continued to rise.  He initially received Casodex and then enzalutamide. Enzalutamide was stopped for GI intolerance.  Progression in bone in May 2013. He was started on a trial of Taxotere 09/30/2012 with poor tolerance.  He subsequently relocated to [place] with family. He was started on a trial of abiraterone with low-dose prednisone August 2014. He was hospitalized September 2014 with a Port-A-Cath infection.   He did not respond to the abiraterone/prednisone with a continued rise in the PSA. Bone scan 06/23/2013 showed focal area of increased activity in the right anterior lateral fourth rib, left posterior eighth rib, sternum, right L1 vertebral body, possibly lower cervical spine. Taxotere chemotherapy and 50 mg per meter square every 3 weeks starting on 08/11/2013. Last treatment given on 11/05/2013 completing 4 cycles of treatment.   Current therapy: Hospice care  Interim History:  Jack Barrett presents today for a followup visit, accompanied by his nephew Jack Barrett who also has power of attorney.Marland Kitchen He is a gentleman with the above history and continues to be on chemotherapy. He tolerated Taxotere poorly with grade 1-2 nausea but no vomiting as well as excessive fatigue and tiredness. He reports Zofran helps with these symptoms. He have also reported grade 2 painful neuropathy. He reports that he is eating better and feels stronger  overall. He mentions the possibility of resuming chemotherapy. His current medications for nausea and vomiting are controlling the symptoms significantly better. He reports she's not had any episodes of nausea or vomiting over the past week. He requests a refill for his cough syrup with codeine he states that he doesn't tolerate codeine in liquid form. He complains of a persistent cough with secretions that are can to yellow. He denies any fever or chills. He reports urinary incontinence. We have given an order for Foley catheter to be placed however the hospice nurse did not place the catheter. He may benefit at least initially from a condom catheter although a Foley may ultimately need to be placed. Currently the patient's nephew, Jack Barrett lives with him and his available 24/7 however he will be starting Graduate school in August and is concerned that his uncle will not take his medications as scheduled if he is not there.  His overall performance status remains poor and mobility is dependent on using a walker. He has not had any falls or any hospitalizations recently. His pain level is under reasonable control for the time being with the help of long-acting narcotics and Dilaudid for breakthrough but he does report needing at least pain medication every 4 hours. He has not reported any fevers or chills or sweats.  He does not report any chest pain or difficulty breathing. Is not reporting any hematochezia or melena. He does report symptoms of anxiety which he uses Xanax as needed. He also have noted pain in the buttocks area and have been diagnosed with pilonidal cysts. His overall quality of life have been poor without any improvement with systemic chemotherapy. He does not report any change  in his bowel habits. Does not report any petechiae or rashes. Remainder of the review of systems unremarkable.  Medications: I have reviewed the patient's current medications.  Current Outpatient Prescriptions   Medication Sig Dispense Refill  . ALPRAZolam (XANAX) 1 MG tablet Take 0.5 tablets (0.5 mg total) by mouth 3 (three) times daily as needed for anxiety or sleep.  90 tablet  0  . amLODipine (NORVASC) 5 MG tablet Take 1 tablet (5 mg total) by mouth daily.  30 tablet  1  . calcium carbonate (OS-CAL) 600 MG TABS tablet Take 600 mg by mouth daily with breakfast.      . gabapentin (NEURONTIN) 300 MG capsule Take 1 capsule (300 mg total) by mouth 3 (three) times daily.  90 capsule  1  . hydrALAZINE (APRESOLINE) 25 MG tablet Take 25 mg by mouth 3 (three) times daily.      Marland Kitchen HYDROmorphone (DILAUDID) 4 MG tablet Take 2 tablets (8 mg total) by mouth every 4 (four) hours as needed for severe pain.  240 tablet  0  . magnesium hydroxide (MILK OF MAGNESIA) 400 MG/5ML suspension Take 5 mLs by mouth daily as needed for mild constipation.      . meclizine (ANTIVERT) 12.5 MG tablet Take 12.5 mg by mouth 3 (three) times daily as needed for dizziness.      Marland Kitchen omeprazole (PRILOSEC) 40 MG capsule Take 40 mg by mouth daily.      . ondansetron (ZOFRAN) 4 MG tablet Take 1 tablet (4 mg total) by mouth every 6 (six) hours.  12 tablet  0  . promethazine (PHENERGAN) 12.5 MG suppository Place 12.5 mg rectally every 6 (six) hours as needed for nausea or vomiting.      . promethazine (PHENERGAN) 25 MG tablet Take 25 mg by mouth every 6 (six) hours as needed for nausea or vomiting.      Marland Kitchen guaiFENesin-codeine (ROBITUSSIN AC) 100-10 MG/5ML syrup Take 5 mLs by mouth 3 (three) times daily as needed for cough.  180 mL  0  . sucralfate (CARAFATE) 1 G tablet Take 1 tablet (1 g total) by mouth 4 (four) times daily -  with meals and at bedtime.  30 tablet  0   No current facility-administered medications for this visit.     Allergies:  Allergies  Allergen Reactions  . Aspirin Other (See Comments)    hemmoriage  . Codeine Other (See Comments)    Hallucinations; takes hydromorphone and morphine at home without issue  . Lactose  Intolerance (Gi) Diarrhea    Past Medical History, Surgical history, Social history, and Family History were reviewed and updated.   Physical Exam: Blood pressure 158/70, pulse 78, temperature 98.4 F (36.9 C), temperature source Oral, resp. rate 18, height 6\' 5"  (1.956 m), weight 175 lb 6.4 oz (79.561 kg). ECOG: 2 General appearance: alert and appears older than stated age Head: Normocephalic, without obvious abnormality, atraumatic Neck: no adenopathy Lymph nodes: Cervical, supraclavicular, and axillary nodes normal. Heart:regular rate and rhythm, S1, S2 normal, no murmur, click, rub or gallop Lung:chest clear, no wheezing, rales, normal symmetric air entry Abdomin: soft, non-tender, without masses or organomegaly EXT:no erythema, induration, or nodules Skin showed no rashes or lesions.  Lab Results: Lab Results  Component Value Date   WBC 8.0 12/23/2013   HGB 10.0* 12/23/2013   HCT 31.1* 12/23/2013   MCV 84.7 12/23/2013   PLT 187 12/23/2013     Chemistry      Component Value Date/Time  NA 138 12/23/2013 1019   NA 139 12/17/2013 1756   K 4.3 12/23/2013 1019   K 3.6* 12/17/2013 1756   CL 103 12/17/2013 1756   CO2 21* 12/23/2013 1019   CO2 22 12/17/2013 1756   BUN 21.0 12/23/2013 1019   BUN 12 12/17/2013 1756   CREATININE 2.0* 12/23/2013 1019   CREATININE 1.29 12/17/2013 1756      Component Value Date/Time   CALCIUM 8.8 12/23/2013 1019   CALCIUM 8.7 12/17/2013 1756   ALKPHOS 81 12/23/2013 1019   ALKPHOS 79 12/17/2013 1756   AST 14 12/23/2013 1019   AST 10 12/17/2013 1756   ALT 7 12/23/2013 1019   ALT <5 12/17/2013 1756   BILITOT <0.20 12/23/2013 1019   BILITOT 0.3 12/17/2013 1756      Results for CYPRIAN, GONGAWARE (MRN [id number]) as of 11/24/2013 12:10  Ref. Range 08/04/2013 15:23 09/16/2013 09:35 10/13/2013 08:44 11/03/2013 14:34  PSA Latest Range: <=4.00 ng/mL 72.22 (H) 93.20 (H) 105.70 (H) 156.90 (H)    Impression and Plan:  68 year old gentleman with the following issues:  1. Castration  resistant metastatic prostate cancer with disease to the bone. He is on low dose Taxotere at 50 mg per meter square every 3 weeks and his PSA continues to rise. He has poor tolerance for Taxotere with clear progression of his disease. Patient was reviewed with Dr. Alen Blew. Given his poor tolerance to chemotherapy and his overall decline makes him a poor candidate for any salvage chemotherapy. We recommend that he remain in hospice care. Both the patient and his nephew are in agreement with this recommendation.  He additionally has an incurable cancer with limited life expectancy of 6 months or less. He had not benefited clinically from palliative chemotherapy and we believe he would benefit from continued hospice care.   2. Androgen depravation: We will suspend Lupron injections for the time being  3. Bone health:  This will be stopped for the time being as we continue palliative and supportive care only.  4. Chronic renal insufficiency: His last creatinine is 2.0 which is close to his baseline.  5. Bone pain: He is currently on long-acting narcotic in the form of MS Contin at 60 mg every 8hours with breakthrough Dilaudid and seems to be under reasonable control. He is to continue these medications under hospice.  6. Peripheral neuropathy: on gabapentin 300 mg to use 3 times a day as needed.   7. Anxiety: He does use Xanax 2-3 times a day for both anxiety and sleep.  8. Followup: On an as-needed basis only   Carlton Adam, PA-C  7/20/20159:31 AM

## 2014-01-07 ENCOUNTER — Telehealth: Payer: Self-pay

## 2014-01-07 NOTE — Telephone Encounter (Signed)
Returned patient phone call Patient not available Left message to return our call

## 2014-01-12 ENCOUNTER — Encounter (INDEPENDENT_AMBULATORY_CARE_PROVIDER_SITE_OTHER): Payer: Medicare Other | Admitting: General Surgery

## 2014-01-12 ENCOUNTER — Telehealth: Payer: Self-pay | Admitting: *Deleted

## 2014-01-12 NOTE — Telephone Encounter (Signed)
Jack Barrett with [facility] calling, patient was transferred to the  [facility] hospice in [place] Note to dr Alen Blew.

## 2014-02-26 ENCOUNTER — Encounter: Payer: Self-pay | Admitting: *Deleted

## 2014-03-11 DEATH — deceased

## 2014-04-25 IMAGING — US US RENAL
1 series · 14 of 25 positions shown · non-contrast
Comparison: Elevated creatinine.  Prostate cancer

CLINICAL DATA: Renal failure

EXAM:
RENAL/URINARY TRACT ULTRASOUND COMPLETE

[Series 1: us renal · 0.22mm/px · 14 of 61 slices shown]
[im 1/61]
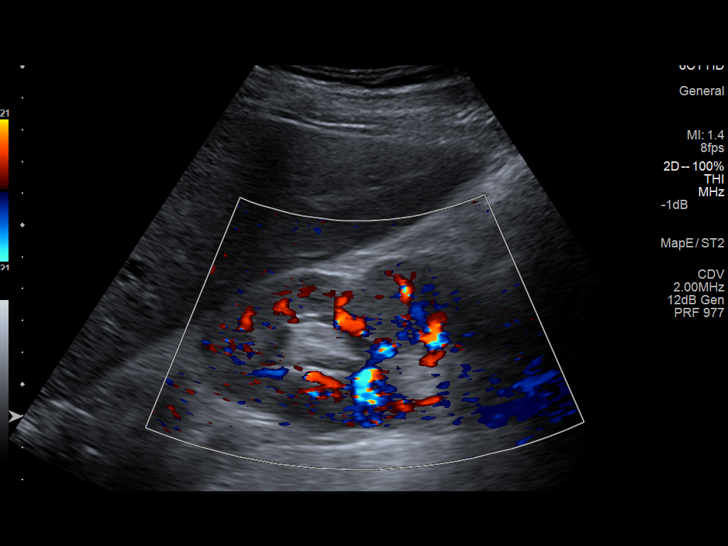
[im 6/61]
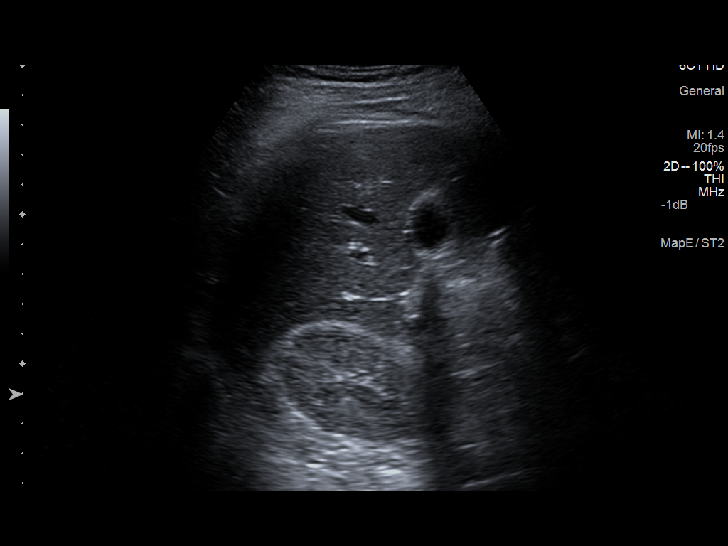
[im 11/61]
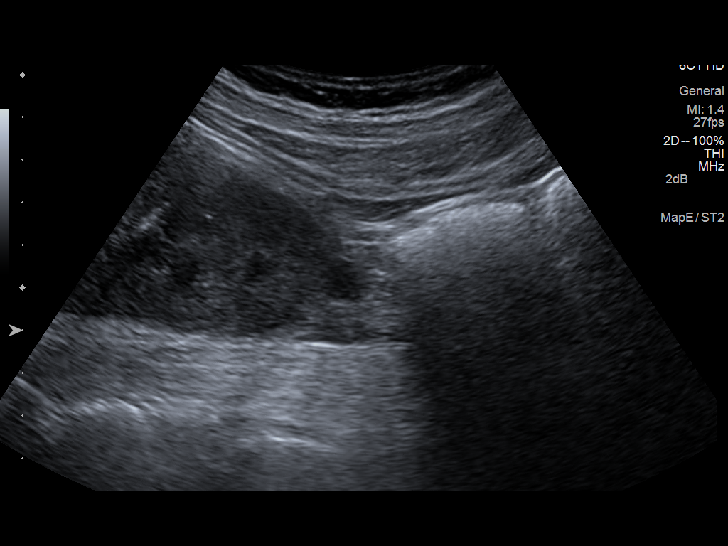
[im 16/61]
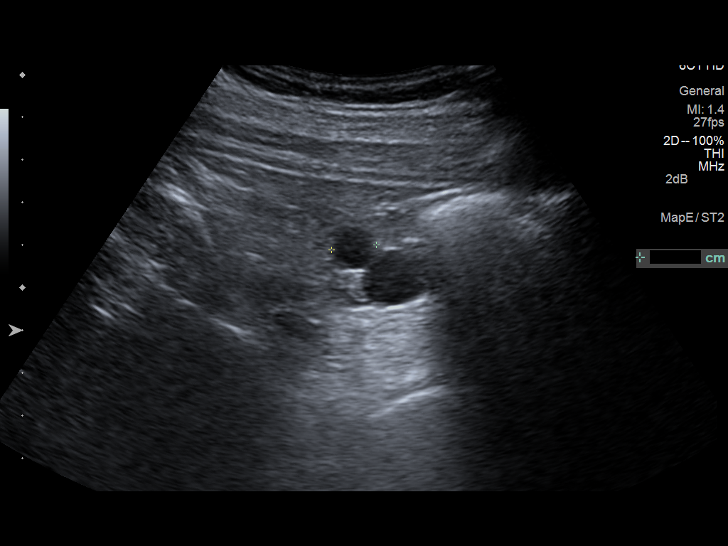
[im 21/61]
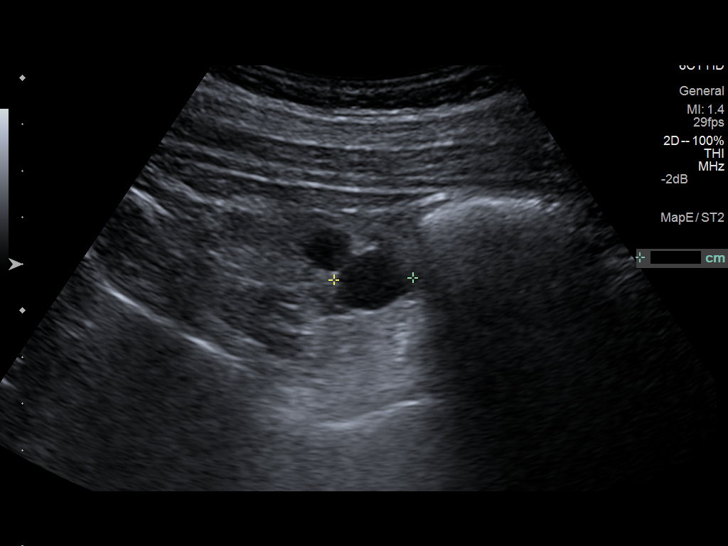
[im 23/61]
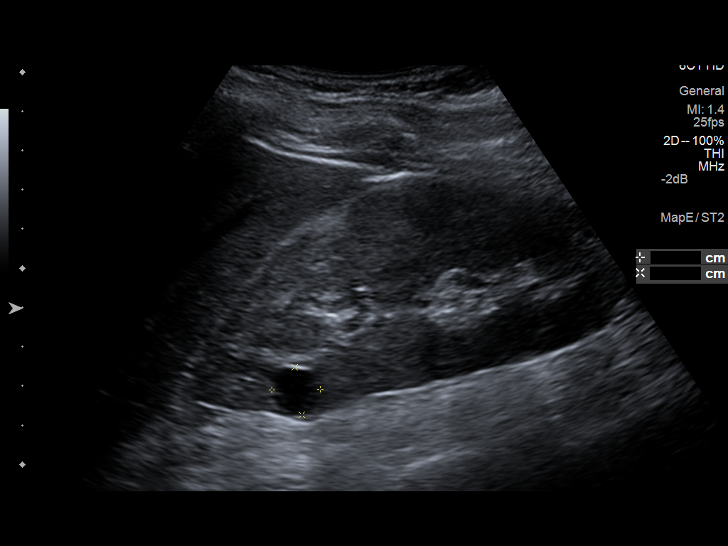
[im 28/61]
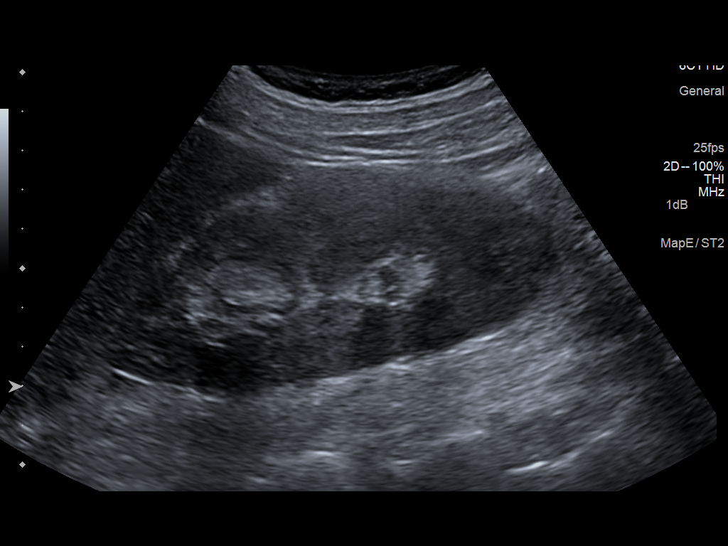
[im 33/61]
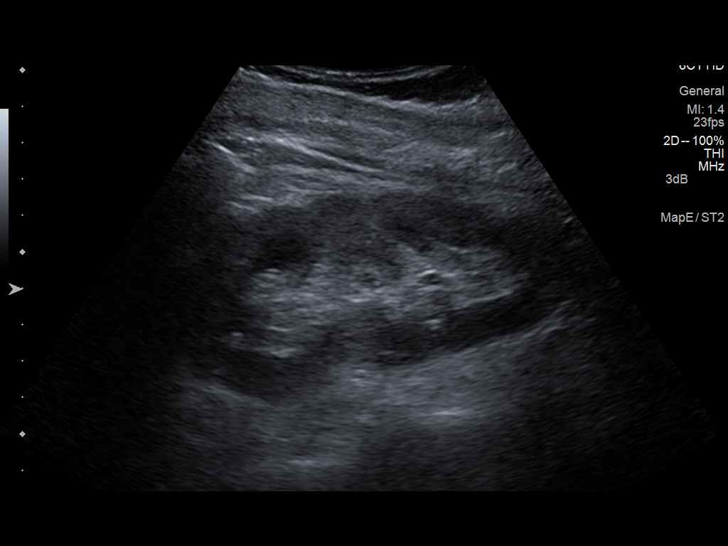
[im 38/61]
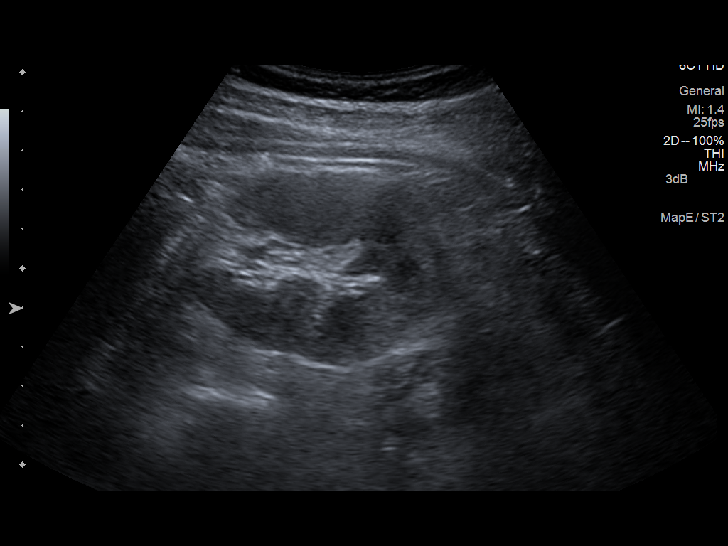
[im 41/61]
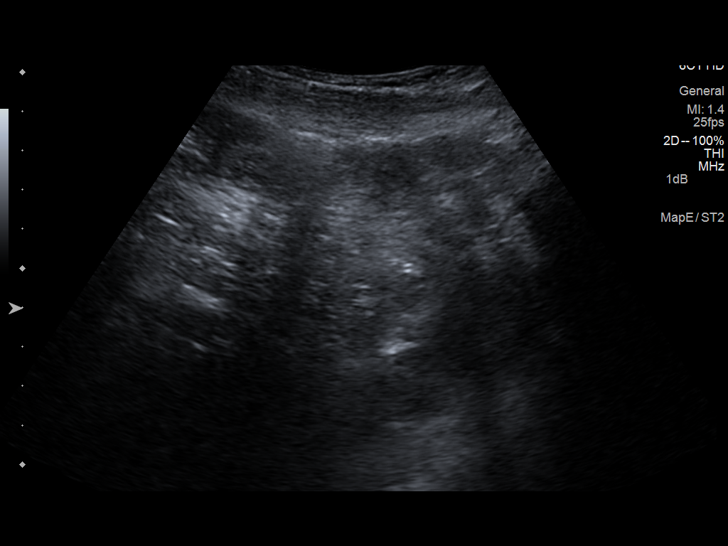
[im 46/61]
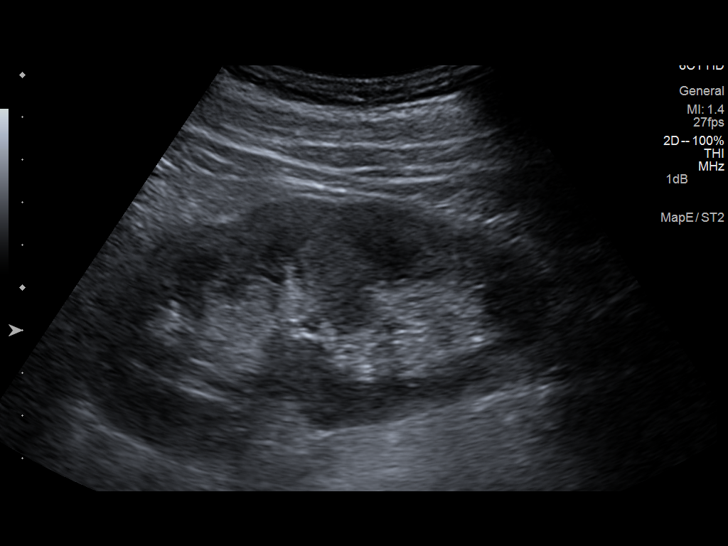
[im 51/61]
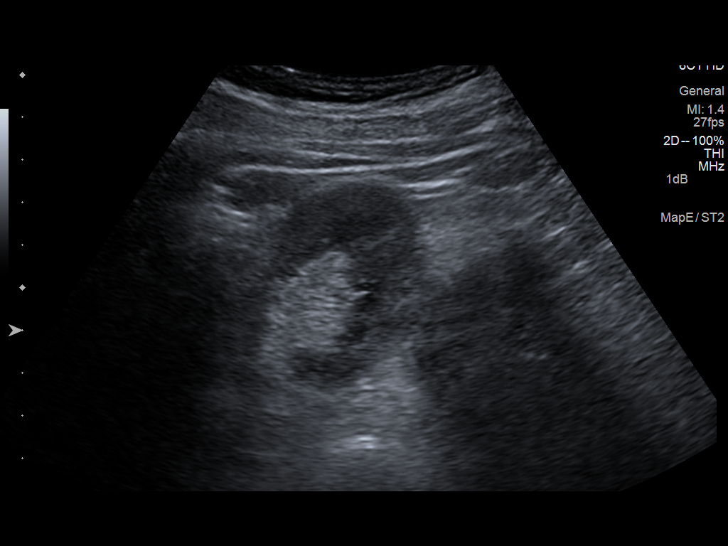
[im 56/61]
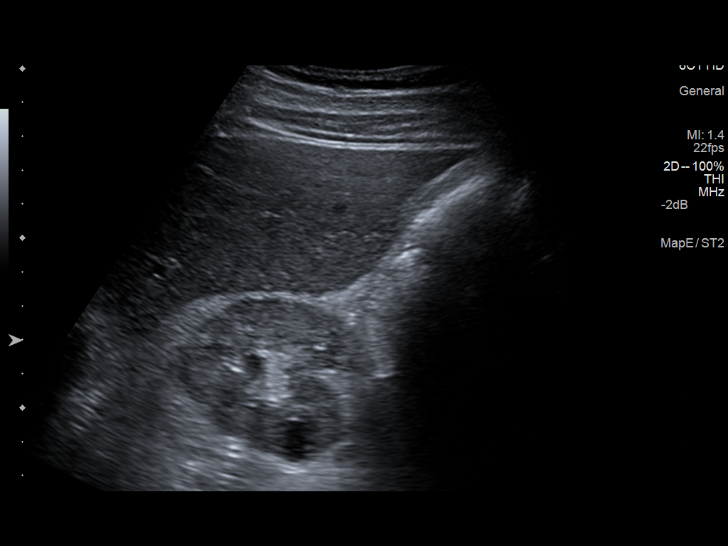
[im 61/61]
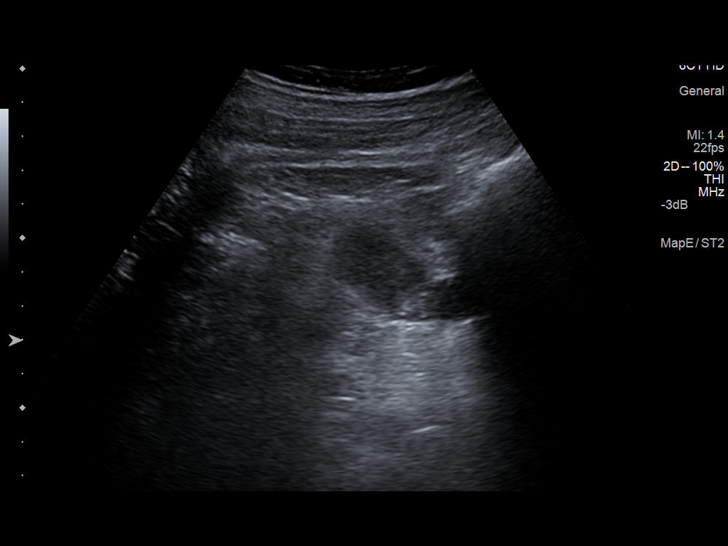

[14 of 25 positions shown; findings below may reference images not displayed]

FINDINGS: Right Kidney:

Length: 12.5 cm. Two small anechoic cysts within the right kidney.
No hydronephrosis. Mild increase in cortical echogenicity.

Left Kidney:

Length: 12.3 cm.. No hydronephrosis. No masses. Mild increase in
cortical echogenicity.

Bladder:

Left identified.
IMPRESSION: 1. No hydronephrosis or evidence of renal obstruction.
2. Mildly echogenic kidneys may represent medical renal disease.

## 2014-12-06 ENCOUNTER — Other Ambulatory Visit: Payer: Self-pay

## 2015-02-04 NOTE — Progress Notes (Signed)
This encounter was created in error - please disregard.
